# Patient Record
Sex: Female | Born: 1995 | Race: White | Hispanic: No | Marital: Single | Smoking: Never smoker
Health system: Southern US, Community
[De-identification: ages and names within clinical notes are randomized; demographics above are authoritative.]

## PROBLEM LIST (undated history)

## (undated) DIAGNOSIS — J45909 Unspecified asthma, uncomplicated: Secondary | ICD-10-CM

## (undated) DIAGNOSIS — T7840XA Allergy, unspecified, initial encounter: Secondary | ICD-10-CM

## (undated) DIAGNOSIS — F419 Anxiety disorder, unspecified: Secondary | ICD-10-CM

## (undated) DIAGNOSIS — I469 Cardiac arrest, cause unspecified: Secondary | ICD-10-CM

## (undated) DIAGNOSIS — F32A Depression, unspecified: Secondary | ICD-10-CM

## (undated) DIAGNOSIS — F329 Major depressive disorder, single episode, unspecified: Secondary | ICD-10-CM

## (undated) DIAGNOSIS — Z309 Encounter for contraceptive management, unspecified: Secondary | ICD-10-CM

## (undated) HISTORY — DX: Unspecified asthma, uncomplicated: J45.909

## (undated) HISTORY — DX: Anxiety disorder, unspecified: F41.9

## (undated) HISTORY — DX: Depression, unspecified: F32.A

## (undated) HISTORY — DX: Major depressive disorder, single episode, unspecified: F32.9

## (undated) HISTORY — DX: Encounter for contraceptive management, unspecified: Z30.9

## (undated) HISTORY — DX: Allergy, unspecified, initial encounter: T78.40XA

---
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
## 2015-08-11 ENCOUNTER — Ambulatory Visit (INDEPENDENT_AMBULATORY_CARE_PROVIDER_SITE_OTHER): Payer: BLUE CROSS/BLUE SHIELD | Admitting: Obstetrics and Gynecology

## 2015-08-11 ENCOUNTER — Encounter: Payer: Self-pay | Admitting: Obstetrics and Gynecology

## 2015-08-11 VITALS — BP 130/80 | HR 74 | Wt 189.6 lb

## 2015-08-11 DIAGNOSIS — Z113 Encounter for screening for infections with a predominantly sexual mode of transmission: Secondary | ICD-10-CM | POA: Diagnosis not present

## 2015-08-11 DIAGNOSIS — R1032 Left lower quadrant pain: Secondary | ICD-10-CM | POA: Diagnosis not present

## 2015-08-11 MED ORDER — ACETAMINOPHEN-CODEINE #3 300-30 MG PO TABS: 1.0000 | ORAL_TABLET | ORAL | Status: DC | PRN

## 2015-08-11 NOTE — Addendum Note (Signed)
Addended by: Federico FlakeNES, Lisel Siegrist A on: 08/11/2015 04:28 PM   Modules accepted: Orders

## 2015-08-11 NOTE — Progress Notes (Signed)
Patient ID: Nichole Guerra, female   DOB: 10/30/1995, 20 y.o.   MRN: [id number]   [facility]  Patient name: Nichole Guerra MRN [id number]  Date of birth: 10/30/1995  CC & HPI:  Nichole Guerra is a 20 y.o. female presenting today for pain in LLQ. Duration: 1 day, steady,  Prior episodes but they were different, and more at night. Bowel fxn normal loose stool, not related to pain.  ROS:  Single partner age 20 x  3 yr. One prior partner.    Pertinent History Reviewed:   Reviewed: Significant for panic disorder. Mother is pt of [facility]. Medical         Past Medical History  Diagnosis Date  . Allergy   . Asthma   . Depression   . Contraceptive management 12/17/2012                              Surgical Hx:   History reviewed. No pertinent past surgical history. Medications: Reviewed & Updated - see associated section                       Current outpatient prescriptions:  .  cetirizine (ZYRTEC) 10 MG tablet, Take 10 mg by mouth daily., Disp: , Rfl:  .  Fluticasone-Salmeterol (ADVAIR) 100-50 MCG/DOSE AEPB, Inhale 1 puff into the lungs daily., Disp: , Rfl:  .  sertraline (ZOLOFT) 50 MG tablet, 50 mg 2 (two) times daily. , Disp: , Rfl:  .  SPRINTEC 28 0.25-35 MG-MCG tablet, TAKE 1 TABLET BY MOUTH DAILY., Disp: 84 tablet, Rfl: 3 .  albuterol (PROVENTIL HFA;VENTOLIN HFA) 108 (90 BASE) MCG/ACT inhaler, Inhale 2 puffs into the lungs as needed for wheezing. Reported on 08/11/2015, Disp: , Rfl:  .  ibuprofen (ADVIL,MOTRIN) 800 MG tablet, 800 mg 2 (two) times daily. Reported on 08/11/2015, Disp: , Rfl:    Social History: Reviewed -  reports that she has never smoked. She has never used smokeless tobacco.  Objective Findings:  Vitals: Blood pressure 130/80, pulse 74, weight 189 lb 9.6 oz (86.002 kg), last menstrual period 07/19/2015.  Physical Examination: General appearance - alert, well appearing, and in no distress, oriented to person, place, and time, overweight and  anxious Mental status - alert, oriented to person, place, and time, normal mood, behavior, speech, dress, motor activity, and thought processes, anxious Eyes - pupils equal and reactive, extraocular eye movements intact Abdomen - soft, nontender, nondistended, no masses or organomegaly tenderness noted llq and at umbilicus mild , not guarding or rebound Pelvic - normal external genitalia, vulva, vagina, cervix, uterus and adnexa Wet prep Neg for trich , normal wbc and epithelials only, no clue cells, Extremities - peripheral pulses normal, no pedal edema, no clubbing or cyanosis Nail beds short   Assessment & Plan:   A:  1. Anxiety factor 2  Normal pelvic normal wet prep 3  LLQ pain, functional bowel pain likely   P:  1. 1. Reasssured, await [facility],  2. refil tylenol #3 3. Fu prn

## 2015-08-12 LAB — GC/CHLAMYDIA PROBE AMP
Chlamydia trachomatis, NAA: NEGATIVE
NEISSERIA GONORRHOEAE BY PCR: NEGATIVE

## 2015-11-02 ENCOUNTER — Other Ambulatory Visit: Payer: Self-pay | Admitting: Adult Health

## 2016-01-28 ENCOUNTER — Other Ambulatory Visit: Payer: Self-pay | Admitting: Adult Health

## 2016-02-22 ENCOUNTER — Emergency Department (HOSPITAL_COMMUNITY)
Admission: EM | Admit: 2016-02-22 | Discharge: 2016-02-22 | Disposition: A | Discharge status: Home / Self Care | Payer: BLUE CROSS/BLUE SHIELD | Attending: Emergency Medicine | Admitting: Emergency Medicine

## 2016-02-22 ENCOUNTER — Encounter (HOSPITAL_COMMUNITY): Payer: Self-pay | Admitting: Emergency Medicine

## 2016-02-22 DIAGNOSIS — J329 Chronic sinusitis, unspecified: Secondary | ICD-10-CM | POA: Diagnosis not present

## 2016-02-22 DIAGNOSIS — Z79899 Other long term (current) drug therapy: Secondary | ICD-10-CM | POA: Diagnosis not present

## 2016-02-22 DIAGNOSIS — F329 Major depressive disorder, single episode, unspecified: Secondary | ICD-10-CM | POA: Diagnosis not present

## 2016-02-22 DIAGNOSIS — J45909 Unspecified asthma, uncomplicated: Secondary | ICD-10-CM | POA: Insufficient documentation

## 2016-02-22 DIAGNOSIS — J069 Acute upper respiratory infection, unspecified: Secondary | ICD-10-CM | POA: Diagnosis not present

## 2016-02-22 DIAGNOSIS — R51 Headache: Secondary | ICD-10-CM | POA: Diagnosis present

## 2016-02-22 HISTORY — DX: Cardiac arrest, cause unspecified: I46.9

## 2016-02-22 MED ORDER — LORATADINE-PSEUDOEPHEDRINE ER 5-120 MG PO TB12: 1.0000 | ORAL_TABLET | Freq: Two times a day (BID) | ORAL | 0 refills | Status: DC

## 2016-02-22 MED ORDER — DEXAMETHASONE 4 MG PO TABS: 4.0000 mg | ORAL_TABLET | Freq: Two times a day (BID) | ORAL | 0 refills | Status: DC

## 2016-02-22 NOTE — ED Provider Notes (Signed)
[facility]-EMERGENCY DEPT Provider Note   CSN: [id number] Arrival date & time: 02/22/16  1337  By signing my name below, I, Placido SouLogan Joldersma, attest that this documentation has been prepared under the direction and in the presence of Ivery QualeHobson Lauretta Sallas, PA-C. Electronically Signed: Placido SouLogan Joldersma, ED Scribe. 02/22/16. 3:20 PM.   History   Chief Complaint No chief complaint on file.  HPI HPI Comments: Nichole Guerra is a 20 y.o. female who presents to the Emergency Department complaining of mild sinus congestion x 1 month. Her mother states that they have been struggling to contact their family physician to make an appointment and came to the ED today for evaluation. She reports associated sinus tenderness and a mild HA. She describes her congestion as "dark". Pt denies fever, chills, rash, nausea, vomiting and diarrhea.   The history is provided by the patient. No language interpreter was used.  Illness  This is a new problem. The current episode started more than 1 week ago. The problem occurs constantly. The problem has not changed since onset.Associated symptoms include headaches. Nothing aggravates the symptoms. Nothing relieves the symptoms. She has tried nothing for the symptoms. The treatment provided no relief.    Past Medical History:  Diagnosis Date  . Allergy   . Asthma   . Cardiac arrest (HCC)   . Contraceptive management 12/17/2012  . Depression     Patient Active Problem List   Diagnosis Date Noted  . LLQ abdominal pain 08/11/2015  . Contraceptive management 12/17/2012  . History of depression 09/21/2012    History reviewed. No pertinent surgical history.  OB History    Gravida Para Term Preterm AB Living   0 0 0 0 0 0   SAB TAB Ectopic Multiple Live Births   0 0 0 0         Home Medications    Prior to Admission medications   Medication Sig Start Date End Date Taking? Authorizing Provider  acetaminophen-codeine (TYLENOL #3) 300-30 MG tablet Take 1-2 tablets by mouth  every 4 (four) hours as needed. 08/11/15   Tilda BurrowJohn V Ferguson, MD  albuterol (PROVENTIL HFA;VENTOLIN HFA) 108 (90 BASE) MCG/ACT inhaler Inhale 2 puffs into the lungs as needed for wheezing. Reported on 08/11/2015    Historical Provider, MD  cetirizine (ZYRTEC) 10 MG tablet Take 10 mg by mouth daily.    Historical Provider, MD  Fluticasone-Salmeterol (ADVAIR) 100-50 MCG/DOSE AEPB Inhale 1 puff into the lungs daily.    Historical Provider, MD  ibuprofen (ADVIL,MOTRIN) 800 MG tablet 800 mg 2 (two) times daily. Reported on 08/11/2015 07/05/14   Historical Provider, MD  MONO-LINYAH 0.25-35 MG-MCG tablet TAKE 1 TABLET BY MOUTH EVERY DAY 01/28/16   Adline PotterJennifer A Griffin, NP  sertraline (ZOLOFT) 50 MG tablet 50 mg 2 (two) times daily.  08/28/12   Historical Provider, MD    Family History Family History  Problem Relation Age of Onset  . Hypertension Mother   . Diabetes Father   . Miscarriages / Stillbirths Maternal Grandmother   . Hypertension Maternal Grandmother   . Heart disease Maternal Grandfather   . Stroke Paternal Grandfather   . Hyperlipidemia Paternal Grandmother   . Asthma Paternal Grandmother   . COPD Paternal Grandmother   . Stroke Other     paternal great grandma    Social History Social History  Substance Use Topics  . Smoking status: Never Smoker  . Smokeless tobacco: Never Used  . Alcohol use No     Allergies   Penicillins;  Shellfish-derived products; Bactrim [sulfamethoxazole-trimethoprim]; and Yellow dyes (non-tartrazine)   Review of Systems Review of Systems  Constitutional: Negative for chills and fever.  HENT: Positive for congestion and sinus pressure. Negative for rhinorrhea.   Gastrointestinal: Negative for diarrhea, nausea and vomiting.  Skin: Negative for rash.  Neurological: Positive for headaches.   Physical Exam Updated Vital Signs BP 131/83 (BP Location: Left Arm)   Pulse 82   Temp 98.7 F (37.1 C) (Oral)   LMP 02/15/2016 (Exact Date)   SpO2 96%    Physical Exam  Constitutional: She is oriented to person, place, and time. She appears well-developed and well-nourished.  HENT:  Head: Normocephalic and atraumatic.  No facial temperature changes. Nasal congestion present.   Eyes: EOM are normal.  Neck: Normal range of motion.  Cardiovascular: Normal rate, regular rhythm and normal heart sounds.  Exam reveals no gallop and no friction rub.   No murmur heard. Pulmonary/Chest: Effort normal. No respiratory distress. She has wheezes (soft expiratory).  Abdominal: Soft. Bowel sounds are normal.  Musculoskeletal: Normal range of motion.  Lymphadenopathy:    She has no cervical adenopathy.  Neurological: She is alert and oriented to person, place, and time.  Skin: Skin is warm and dry. Capillary refill takes less than 2 seconds. No rash noted.  No rash noted to the palms.   Psychiatric: She has a normal mood and affect.  Nursing note and vitals reviewed.  ED Treatments / Results  Labs (all labs ordered are listed, but only abnormal results are displayed) Labs Reviewed - No data to display  EKG  EKG Interpretation None       Radiology No results found.  Procedures Procedures  DIAGNOSTIC STUDIES: Oxygen Saturation is 96% on RA, normal by my interpretation.    COORDINATION OF CARE: 3:20 PM Discussed next steps with pt. Pt verbalized understanding and is agreeable with the plan.    Medications Ordered in ED Medications - No data to display   Initial Impression / Assessment and Plan / ED Course  I have reviewed the triage vital signs and the nursing notes.  Pertinent labs & imaging results that were available during my care of the patient were reviewed by me and considered in my medical decision making (see chart for details).  Clinical Course    **I have reviewed nursing notes, vital signs, and all appropriate lab and imaging results for this patient.*  **I personally performed the services described in this  documentation, which was scribed in my presence. The recorded information has been reviewed and is accurate.* Final Clinical Impressions(s) / ED Diagnoses  The examination is c/w sinusitis and URI with exacerbation of asthma. Rx for claritin D and decadron given to the patient. Pt advised to increase fluids and see PCP for any additional problem.   Final diagnoses:  Sinusitis, unspecified chronicity, unspecified location  Upper respiratory tract infection, unspecified type    New Prescriptions New Prescriptions   No medications on file     Ivery Quale, PA-C 02/22/16 1554    Vanetta Mulders, MD 02/23/16 [phone]

## 2016-02-22 NOTE — ED Triage Notes (Signed)
Pt reports sinus pressure x1 month.  Pt is congested in sinuses, pt states she tries to blow nose but nothing will come out. Pt has cough.  Pt alert and oriented, no breathing difficulties.

## 2016-02-22 NOTE — Discharge Instructions (Signed)
°   Please increase water, juices, Gatorade's, etc. Use claritin D every 12 hours for congestion. Use decadron 2 times daily with food. Please see your primary MD for additional evaluation.

## 2016-04-15 ENCOUNTER — Other Ambulatory Visit: Payer: Self-pay | Admitting: Adult Health

## 2016-06-21 ENCOUNTER — Encounter: Payer: Self-pay | Admitting: Adult Health

## 2016-06-21 ENCOUNTER — Ambulatory Visit (INDEPENDENT_AMBULATORY_CARE_PROVIDER_SITE_OTHER): Payer: Managed Care, Other (non HMO) | Admitting: Adult Health

## 2016-06-21 VITALS — BP 118/60 | HR 72 | Ht 63.0 in | Wt 208.5 lb

## 2016-06-21 DIAGNOSIS — N938 Other specified abnormal uterine and vaginal bleeding: Secondary | ICD-10-CM | POA: Diagnosis not present

## 2016-06-21 DIAGNOSIS — Z3041 Encounter for surveillance of contraceptive pills: Secondary | ICD-10-CM | POA: Diagnosis not present

## 2016-06-21 DIAGNOSIS — R252 Cramp and spasm: Secondary | ICD-10-CM

## 2016-06-21 DIAGNOSIS — Z3202 Encounter for pregnancy test, result negative: Secondary | ICD-10-CM

## 2016-06-21 DIAGNOSIS — N926 Irregular menstruation, unspecified: Secondary | ICD-10-CM

## 2016-06-21 LAB — POCT URINE PREGNANCY: PREG TEST UR: NEGATIVE

## 2016-06-21 MED ORDER — NORGESTIMATE-ETH ESTRADIOL 0.25-35 MG-MCG PO TABS: 1.0000 | ORAL_TABLET | Freq: Every day | ORAL | 3 refills | Status: DC

## 2016-06-21 NOTE — Progress Notes (Signed)
Subjective:     Patient ID: Nichole Guerra, female   DOB: 1995/07/14, 21 y.o.   MRN: [id number]  HPI [name] is a 21 year old white female in complaining of having 2 week episode of bleeding, 1 week like period and 1 light then heavy then light with cramps,no pain with sex.She would like to explore long term birth control options, too.She says she has not missed a pill or been on any new meds.    Review of Systems Had 2 week episode of bleeding, 1 week like period and 1 light then heavy then light with cramps,no pain with sex Reviewed past medical,surgical, social and family history. Reviewed medications and allergies.     Objective:   Physical Exam BP 118/60 (BP Location: Left Arm, Patient Position: Sitting, Cuff Size: Large)   Pulse 72   Ht 5\' 3"  (1.6 m)   Wt 208 lb 8 oz (94.6 kg)   LMP 05/31/2016 (Approximate)   BMI 36.93 kg/m UPT negative.PHQ 2 score 0. Skin warm and dry.Pelvic: external genitalia is normal in appearance no lesions, vagina: scant discharge without odor,urethra has no lesions or masses noted, cervix:smooth and nulliparous, uterus: normal size, shape and contour, non tender, no masses felt, adnexa: no masses or tenderness noted. Bladder is non tender and no masses felt.  GC/CHL obtained.    Discussed IUD, and nexplanon, there risks and benefits and the need to insertion when on period, and if chooses IUD will need cytotec morning of procedure.Call Angie to check insurance benefits when choice made. Face time 15 minutes with 50% counseling as above.  Assessment:     1. Irregular bleeding   2. Encounter for surveillance of contraceptive pills   3. Pregnancy examination or test, negative result       Plan:     GC/CHL sent Refilled ortho cyclen for 1 year Continue OCs for now and review handouts on IUD and nexplanon Follow up prn

## 2016-06-24 LAB — GC/CHLAMYDIA PROBE AMP
CHLAMYDIA, DNA PROBE: NEGATIVE
Neisseria gonorrhoeae by PCR: NEGATIVE

## 2017-01-27 ENCOUNTER — Ambulatory Visit: Payer: BLUE CROSS/BLUE SHIELD | Admitting: Family Medicine

## 2017-02-02 ENCOUNTER — Ambulatory Visit (INDEPENDENT_AMBULATORY_CARE_PROVIDER_SITE_OTHER): Payer: Managed Care, Other (non HMO) | Admitting: Family Medicine

## 2017-02-02 ENCOUNTER — Encounter: Payer: Self-pay | Admitting: Family Medicine

## 2017-02-02 VITALS — BP 118/66 | HR 88 | Temp 98.3°F | Resp 18 | Ht 63.0 in | Wt 214.3 lb

## 2017-02-02 DIAGNOSIS — E669 Obesity, unspecified: Secondary | ICD-10-CM | POA: Insufficient documentation

## 2017-02-02 DIAGNOSIS — Z91018 Allergy to other foods: Secondary | ICD-10-CM | POA: Insufficient documentation

## 2017-02-02 DIAGNOSIS — J4540 Moderate persistent asthma, uncomplicated: Secondary | ICD-10-CM | POA: Diagnosis not present

## 2017-02-02 DIAGNOSIS — Z9109 Other allergy status, other than to drugs and biological substances: Secondary | ICD-10-CM | POA: Diagnosis not present

## 2017-02-02 DIAGNOSIS — Z6837 Body mass index (BMI) 37.0-37.9, adult: Secondary | ICD-10-CM | POA: Diagnosis not present

## 2017-02-02 DIAGNOSIS — E661 Drug-induced obesity: Secondary | ICD-10-CM

## 2017-02-02 DIAGNOSIS — Z23 Encounter for immunization: Secondary | ICD-10-CM

## 2017-02-02 DIAGNOSIS — F418 Other specified anxiety disorders: Secondary | ICD-10-CM

## 2017-02-02 MED ORDER — FLUOXETINE HCL 20 MG PO TABS: 20.0000 mg | ORAL_TABLET | Freq: Every day | ORAL | 3 refills | Status: DC

## 2017-02-02 MED ORDER — FLUTICASONE-SALMETEROL 250-50 MCG/DOSE IN AEPB: 1.0000 | INHALATION_SPRAY | Freq: Two times a day (BID) | RESPIRATORY_TRACT | 3 refills | Status: DC

## 2017-02-02 MED ORDER — MONTELUKAST SODIUM 10 MG PO TABS: 10.0000 mg | ORAL_TABLET | Freq: Every day | ORAL | 3 refills | Status: DC

## 2017-02-02 NOTE — Progress Notes (Signed)
Chief Complaint  Patient presents with  . Follow-up  new patient Significant problems with asthma and allergies.  Food allergies, environmental allergies, drug allergies and wheezing.  Is on Dulera, but says she had better result with advair.  Uses albuterol infrequently.  Is on singulair daily.  Never been seen by allergy specialist due to her fear of the multiple injections for allergy shots and testing.  Today agrees to go. She also has trouble with anxiety.  She went to the ER last weekend with panic attack.  Feels her Zoloft is not working any more.  Has been on may years.  Blames the zoloft for gaining weight.  Has little energy.  No thoughts of harming self or others. Poor exercise tolerance due to asthma.  Discussed pre-medicating with albuterol to prevent wheezing.  Needs to slowly build back up walking tolerance for her health and to lose weight. Sees GYN and takes an OCP.   Up to date with immunizations    Patient Active Problem List   Diagnosis Date Noted  . Asthma, moderate persistent 02/02/2017  . Environmental allergies 02/02/2017  . Food allergy 02/02/2017  . Depression with anxiety 02/02/2017  . 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 Contraceptive management 12/17/2012    Outpatient Encounter Prescriptions as of 02/02/2017  Medication Sig  . albuterol (PROVENTIL HFA;VENTOLIN HFA) 108 (90 BASE) MCG/ACT inhaler Inhale 2 puffs into the lungs as needed for wheezing. Reported on 08/11/2015  . cetirizine (ZYRTEC) 10 MG tablet Take 10 mg by mouth daily.  . hydrOXYzine (ATARAX/VISTARIL) 50 MG tablet Take 50 mg by mouth daily.  . mometasone-formoterol (DULERA) 100-5 MCG/ACT AERO Inhale 2 puffs into the lungs 2 (two) times daily.  . montelukast (SINGULAIR) 10 MG tablet Take 1 tablet (10 mg total) by mouth at bedtime.  . norgestimate-ethinyl estradiol (MONO-LINYAH) 0.25-35 MG-MCG tablet Take 1 tablet by mouth daily.  Marland Kitchen  FLUoxetine (PROZAC) 20 MG tablet Take 1 tablet (20 mg total) by mouth daily.  . Fluticasone-Salmeterol (ADVAIR DISKUS) 250-50 MCG/DOSE AEPB Inhale 1 puff into the lungs 2 (two) times daily.   No facility-administered encounter medications on file as of 02/02/2017.     Past Medical History:  Diagnosis Date  . Allergy   . Anxiety   . Asthma   . Cardiac arrest (HCC)   . Contraceptive management 12/17/2012  . Depression     History reviewed. No pertinent surgical history.  Social History   Social History  . Marital status: Single    Spouse name: N/A  . Number of children: N/A  . Years of education: 5- GED   Occupational History  . pharmacy reg [employer]   Social History Main Topics  . Smoking status: Never Smoker  . Smokeless tobacco: Never Used  . Alcohol use No  . Drug use: No  . Sexual activity: Yes    Birth control/ protection: Pill   Other Topics Concern  . Not on file   Social History Narrative   Lives with parents   Younger brother Casimiro Needle in the home   Artistic, likes to craft and draw   Likes outdoors    Family History  Problem Relation Age of Onset  . Hypertension Mother   . Other Mother        pre-cancerous cells on cervix  . Diabetes Father   . Miscarriages / Stillbirths Maternal Grandmother   . Hypertension Maternal Grandmother   . Heart  disease Maternal Grandfather   . Stroke Paternal Grandfather   . Hyperlipidemia Paternal Grandmother   . Asthma Paternal Grandmother   . COPD Paternal Grandmother   . Stroke Other        paternal great grandma  . Cancer Maternal Uncle        prostate, bladder, bone    Review of Systems  Constitutional: Negative for chills, fever and weight loss.  HENT: Negative for congestion and hearing loss.   Eyes: Negative for blurred vision and pain.  Respiratory: Positive for shortness of breath. Negative for cough.   Cardiovascular: Negative for chest pain and leg swelling.  Gastrointestinal: Negative for abdominal  pain, constipation, diarrhea and heartburn.  Genitourinary: Negative for dysuria and frequency.  Musculoskeletal: Negative for falls, joint pain and myalgias.  Neurological: Negative for dizziness, seizures and headaches.  Psychiatric/Behavioral: Negative for depression. The patient is nervous/anxious. The patient does not have insomnia.     BP 118/66 (BP Location: Right Arm, Patient Position: Sitting, Cuff Size: Normal)   Pulse 88   Temp 98.3 F (36.8 C) (Temporal)   Resp 18   Ht  (1.6 m)   Wt 214 lb 4.8 oz (97.2 kg)   LMP 01/19/2017 (Exact Date)   SpO2 98%   BMI 37.96 kg/m   Physical Exam  Constitutional: She is oriented to person, place, and time. She appears well-developed and well-nourished.  HENT:  Head: Normocephalic and atraumatic.  Mouth/Throat: Oropharynx is clear and moist.  Eyes: Pupils are equal, round, and reactive to light. Conjunctivae are normal.  Neck: Normal range of motion. Neck supple. No thyromegaly present.  Cardiovascular: Normal rate, regular rhythm and normal heart sounds.   Pulmonary/Chest: Effort normal and breath sounds normal. No respiratory distress.  Breathless when speaking - but lungs clear  Abdominal: Soft. Bowel sounds are normal.  Musculoskeletal: Normal range of motion. She exhibits no edema.  Lymphadenopathy:    She has no cervical adenopathy.  Neurological: She is alert and oriented to person, place, and time.  Gait normal  Skin: Skin is warm and dry.  Psychiatric: She has a normal mood and affect. Her behavior is normal. Thought content normal.  anxious  Nursing note and vitals reviewed.  ASSESSMENT/PLAN:  1. Moderate persistent asthma without complication - Ambulatory referral to Allergy  2. Environmental allergies  3. Food allergy  4. Depression with anxiety  5. Class 2 drug-induced obesity without serious comorbidity with body mass index (BMI) of 37.0 to 37.9 in adult - CBC with Differential/Platelet - COMPLETE  METABOLIC PANEL WITH GFR - Hemoglobin A1c - TSH - Urinalysis, Routine w reflex microscopic - Lipid panel  6. Need for pneumococcal vaccination - Pneumococcal conjugate vaccine 13-valent IM Greater than 50% of this visit was spent in counseling and coordinating care.  Total face to face time:   60 min spent in discussing asthma managekemtna nd referral, also SSRI medicines , side effects and self care for depression   Patient Instructions  Stop the sertraline ( zoloft) Take the Fluoxetine (prozac) daily  I am referring you to an allergy specialist for additional testing  You are due for blood work Prevnar today  Walk every day that you are able  See me in one month for PE    Eustace Moore, MD

## 2017-02-02 NOTE — Patient Instructions (Signed)
Stop the sertraline ( zoloft) Take the Fluoxetine (prozac) daily  I am referring you to an allergy specialist for additional testing  You are due for blood work Prevnar today  Walk every day that you are able  See me in one month for PE

## 2017-02-03 ENCOUNTER — Ambulatory Visit: Payer: BLUE CROSS/BLUE SHIELD | Admitting: Family Medicine

## 2017-02-03 LAB — COMPLETE METABOLIC PANEL WITH GFR
AG RATIO: 1.4 (calc) (ref 1.0–2.5)
ALBUMIN MSPROF: 3.8 g/dL (ref 3.6–5.1)
ALT: 14 U/L (ref 6–29)
AST: 13 U/L (ref 10–30)
Alkaline phosphatase (APISO): 68 U/L (ref 33–115)
BILIRUBIN TOTAL: 0.3 mg/dL (ref 0.2–1.2)
BUN / CREAT RATIO: 8 (calc) (ref 6–22)
BUN: 6 mg/dL — ABNORMAL LOW (ref 7–25)
CALCIUM: 8.7 mg/dL (ref 8.6–10.2)
CHLORIDE: 106 mmol/L (ref 98–110)
CO2: 24 mmol/L (ref 20–32)
Creat: 0.72 mg/dL (ref 0.50–1.10)
GFR, EST AFRICAN AMERICAN: 140 mL/min/{1.73_m2} (ref >=60)
GFR, EST NON AFRICAN AMERICAN: 121 mL/min/{1.73_m2} (ref >=60)
GLOBULIN: 2.7 g/dL (ref 1.9–3.7)
Glucose, Bld: 114 mg/dL (ref 65–139)
POTASSIUM: 3.9 mmol/L (ref 3.5–5.3)
SODIUM: 139 mmol/L (ref 135–146)
TOTAL PROTEIN: 6.5 g/dL (ref 6.1–8.1)

## 2017-02-03 LAB — CBC WITH DIFFERENTIAL/PLATELET
BASOS PCT: 0.8 %
Basophils Absolute: 60 cells/uL (ref 0–200)
EOS ABS: 1028 {cells}/uL — AB (ref 15–500)
Eosinophils Relative: 13.7 %
HEMATOCRIT: 37.4 % (ref 35.0–45.0)
HEMOGLOBIN: 12.5 g/dL (ref 11.7–15.5)
LYMPHS ABS: 2678 {cells}/uL (ref 850–3900)
MCH: 26.2 pg — AB (ref 27.0–33.0)
MCHC: 33.4 g/dL (ref 32.0–36.0)
MCV: 78.2 fL — AB (ref 80.0–100.0)
MPV: 10.2 fL (ref 7.5–12.5)
Monocytes Relative: 6 %
Neutro Abs: 3285 cells/uL (ref 1500–7800)
Neutrophils Relative %: 43.8 %
Platelets: 409 10*3/uL — ABNORMAL HIGH (ref 140–400)
RBC: 4.78 10*6/uL (ref 3.80–5.10)
RDW: 14.1 % (ref 11.0–15.0)
TOTAL LYMPHOCYTE: 35.7 %
WBC: 7.5 10*3/uL (ref 3.8–10.8)
WBCMIX: 450 {cells}/uL (ref 200–950)

## 2017-02-03 LAB — URINALYSIS, ROUTINE W REFLEX MICROSCOPIC
BILIRUBIN URINE: NEGATIVE
Bacteria, UA: NONE SEEN /HPF
GLUCOSE, UA: NEGATIVE
HGB URINE DIPSTICK: NEGATIVE
Hyaline Cast: NONE SEEN /LPF
KETONES UR: NEGATIVE
NITRITE: NEGATIVE
PH: 6 (ref 5.0–8.0)
Protein, ur: NEGATIVE
RBC / HPF: NONE SEEN /HPF (ref 0–2)
SPECIFIC GRAVITY, URINE: 1.021 (ref 1.001–1.03)

## 2017-02-03 LAB — LIPID PANEL
CHOL/HDL RATIO: 3.2 (calc) (ref <5.0)
Cholesterol: 136 mg/dL (ref <200)
HDL: 42 mg/dL — ABNORMAL LOW (ref >50)
LDL Cholesterol (Calc): 68 mg/dL (calc)
Non-HDL Cholesterol (Calc): 94 mg/dL (calc) (ref <130)
Triglycerides: 182 mg/dL — ABNORMAL HIGH (ref <150)

## 2017-02-03 LAB — TSH: TSH: 2.93 mIU/L

## 2017-02-03 LAB — HEMOGLOBIN A1C
HEMOGLOBIN A1C: 5.3 %{Hb} (ref <5.7)
Mean Plasma Glucose: 105 (calc)
eAG (mmol/L): 5.8 (calc)

## 2017-02-08 ENCOUNTER — Encounter: Payer: Self-pay | Admitting: Family Medicine

## 2017-02-15 ENCOUNTER — Other Ambulatory Visit: Payer: Self-pay | Admitting: *Deleted

## 2017-02-15 MED ORDER — NORGESTIMATE-ETH ESTRADIOL 0.25-35 MG-MCG PO TABS: 1.0000 | ORAL_TABLET | Freq: Every day | ORAL | 3 refills | Status: DC

## 2017-03-08 ENCOUNTER — Other Ambulatory Visit: Payer: Self-pay

## 2017-03-09 ENCOUNTER — Encounter: Payer: Self-pay | Admitting: Family Medicine

## 2017-03-09 ENCOUNTER — Ambulatory Visit (INDEPENDENT_AMBULATORY_CARE_PROVIDER_SITE_OTHER): Payer: Managed Care, Other (non HMO) | Admitting: Family Medicine

## 2017-03-09 VITALS — BP 130/80 | HR 92 | Temp 98.1°F | Resp 16 | Ht 63.0 in | Wt 208.0 lb

## 2017-03-09 DIAGNOSIS — Z0000 Encounter for general adult medical examination without abnormal findings: Secondary | ICD-10-CM

## 2017-03-09 DIAGNOSIS — F418 Other specified anxiety disorders: Secondary | ICD-10-CM

## 2017-03-09 MED ORDER — FLUOXETINE HCL 40 MG PO CAPS: 40.0000 mg | ORAL_CAPSULE | Freq: Every day | ORAL | 3 refills | Status: DC

## 2017-03-09 NOTE — Progress Notes (Signed)
Chief Complaint  Patient presents with  . Annual Exam   Here for annual examination. She had her flu shot last time. She is up-to-date with Pap smear. At last visit she was switched from sertraline to Prozac.  She feels like it is helping.  She has lost weight.  She is more energetic.  No side effects.  She states she still has a few panic attacks.  She wonders whether her dose can be increased.  She is taking 20 mg a day.  I will place her on 40 mg a day.  She will let me know if this does not work  Patient Active Problem List   Diagnosis Date Noted  . Asthma, moderate persistent 02/02/2017  . Environmental allergies 02/02/2017  . Food allergy 02/02/2017  . Depression with anxiety 02/02/2017  . 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 Contraceptive management 12/17/2012    Outpatient Encounter Prescriptions as of 03/09/2017  Medication Sig  . albuterol (PROVENTIL HFA;VENTOLIN HFA) 108 (90 BASE) MCG/ACT inhaler Inhale 2 puffs into the lungs as needed for wheezing. Reported on 08/11/2015  . cetirizine (ZYRTEC) 10 MG tablet Take 10 mg by mouth daily.  . Fluticasone-Salmeterol (ADVAIR DISKUS) 250-50 MCG/DOSE AEPB Inhale 1 puff into the lungs 2 (two) times daily.  . hydrOXYzine (ATARAX/VISTARIL) 50 MG tablet Take 50 mg by mouth daily.  . mometasone-formoterol (DULERA) 100-5 MCG/ACT AERO Inhale 2 puffs into the lungs 2 (two) times daily.  . montelukast (SINGULAIR) 10 MG tablet Take 1 tablet (10 mg total) by mouth at bedtime.  . norgestimate-ethinyl estradiol (MONO-LINYAH) 0.25-35 MG-MCG tablet Take 1 tablet by mouth daily.  . [DISCONTINUED] FLUoxetine (PROZAC) 20 MG tablet Take 1 tablet (20 mg total) by mouth daily.  Marland Kitchen. FLUoxetine (PROZAC) 40 MG capsule Take 1 capsule (40 mg total) by mouth daily.   No facility-administered encounter medications on file as of 03/09/2017.     Allergies  Allergen Reactions  . Penicillins  Anaphylaxis    Throat swells up.  Marland Kitchen. Shellfish-Derived Products Anaphylaxis  . Bactrim [Sulfamethoxazole-Trimethoprim] Hives  . Yellow Dyes (Non-Tartrazine) Rash    Review of Systems  Constitutional: Negative for activity change, appetite change and unexpected weight change.  HENT: Negative for congestion, dental problem, postnasal drip and rhinorrhea.   Eyes: Negative for redness and visual disturbance.  Respiratory: Negative for cough and shortness of breath.   Cardiovascular: Negative for chest pain, palpitations and leg swelling.  Gastrointestinal: Negative for abdominal pain, constipation and diarrhea.  Genitourinary: Negative for difficulty urinating, frequency and menstrual problem.  Musculoskeletal: Negative for arthralgias and back pain.  Neurological: Negative for dizziness and headaches.  Psychiatric/Behavioral: Negative for dysphoric mood and sleep disturbance. The patient is not nervous/anxious.        Anxiety improved    BP 130/80 (BP Location: Right Arm, Patient Position: Sitting, Cuff Size: Normal)   Pulse 92   Temp 98.1 F (36.7 C) (Temporal)   Resp 16   Ht 5\' 3"  (1.6 m)   Wt 208 lb 0.6 oz (94.4 kg)   SpO2 98%   BMI 36.85 kg/m   Physical Exam BP 130/80 (BP Location: Right Arm, Patient Position: Sitting, Cuff Size: Normal)   Pulse 92   Temp 98.1 F (36.7 C) (Temporal)   Resp 16   Ht 5\' 3"  (1.6 m)   Wt 208 lb 0.6 oz (94.4 kg)   SpO2 98%   BMI 36.85 kg/m  General Appearance:    Alert, cooperative, no distress, appears stated age  Head:    Normocephalic, without obvious abnormality, atraumatic  Eyes:    PERRL, conjunctiva/corneas clear, EOM's intact, fundi    benign, both eyes  Ears:    Normal TM's and external ear canals, both ears  Nose:   Nares normal, septum midline, mucosa normal, no drainage    or sinus tenderness  Throat:   Lips, mucosa, and tongue normal; teeth in good repair and gums normal  Neck:   Supple, symmetrical, trachea midline, no  adenopathy;    thyroid:  no enlargement/tenderness/nodules  Back:     Symmetric, no curvature, ROM normal, no CVA tenderness  Lungs:     Clear to auscultation bilaterally, respirations unlabored  Chest Wall:    No tenderness or deformity   Heart:    Regular rate and rhythm, S1 and S2 normal, no murmur, rub   or gallop  Breast Exam:   Deferred.  Discussed self breast exam  Abdomen:     Soft, non-tender, bowel sounds active all four quadrants,    no masses, no organomegaly  Extremities:   Extremities normal, atraumatic, no cyanosis or edema  Pulses:   2+ and symmetric all extremities  Skin:   Skin color, texture, turgor normal, no rashes or lesions. striae on abdomen  Lymph nodes:   Cervical, supraclavicular, and axillary nodes normal  Neurologic:   CNII-XII intact, normal strength, sensation and reflexes    throughout   ASSESSMENT/PLAN:  1. Annual physical exam With no abnormal findings  2. Depression with anxiety Improved on fluoxetine  Discussed with patient safe sex, prevention of STD.  Discussed immunizations, patient is up-to-date.  Discussed referral to allergy, patient has appointment next week.  Discussed healthy lifestyle, diet, exercise recommendations.  Goal is to continue to lose weight.   Patient Instructions  Take the fluoxetine 40 mg a day Congratulations on losing 6 lbs Exercise every day that you are able See me in 6 months Call sooner for problems   Eustace Moore, MD

## 2017-03-09 NOTE — Patient Instructions (Signed)
Take the fluoxetine 40 mg a day Congratulations on losing 6 lbs Exercise every day that you are able See me in 6 months Call sooner for problems

## 2017-03-17 ENCOUNTER — Encounter: Payer: Self-pay | Admitting: Family Medicine

## 2017-03-21 ENCOUNTER — Telehealth: Payer: Self-pay

## 2017-03-21 MED ORDER — HYDROXYZINE PAMOATE 50 MG PO CAPS: 50.0000 mg | ORAL_CAPSULE | Freq: Three times a day (TID) | ORAL | 0 refills | Status: DC | PRN

## 2017-03-21 MED ORDER — BENZONATATE 100 MG PO CAPS: 100.0000 mg | ORAL_CAPSULE | Freq: Two times a day (BID) | ORAL | 0 refills | Status: DC | PRN

## 2017-03-21 NOTE — Telephone Encounter (Signed)
rx sent to pharmacy

## 2017-03-28 ENCOUNTER — Ambulatory Visit: Payer: Managed Care, Other (non HMO) | Admitting: Allergy & Immunology

## 2017-03-29 ENCOUNTER — Other Ambulatory Visit: Payer: Self-pay

## 2017-04-04 ENCOUNTER — Encounter: Payer: Self-pay | Admitting: Allergy & Immunology

## 2017-04-14 ENCOUNTER — Other Ambulatory Visit: Payer: Self-pay

## 2017-06-30 ENCOUNTER — Other Ambulatory Visit: Payer: Self-pay | Admitting: Family Medicine

## 2017-06-30 NOTE — Telephone Encounter (Signed)
Seen 10 25 18 

## 2017-07-19 ENCOUNTER — Encounter: Payer: Self-pay | Admitting: Family Medicine

## 2017-07-20 ENCOUNTER — Other Ambulatory Visit: Payer: Self-pay | Admitting: Family Medicine

## 2017-07-20 MED ORDER — MONTELUKAST SODIUM 10 MG PO TABS: 10.0000 mg | ORAL_TABLET | Freq: Every day | ORAL | 1 refills | Status: DC

## 2017-07-20 MED ORDER — ALBUTEROL SULFATE HFA 108 (90 BASE) MCG/ACT IN AERS: 2.0000 | INHALATION_SPRAY | RESPIRATORY_TRACT | 1 refills | Status: DC | PRN

## 2017-07-24 ENCOUNTER — Other Ambulatory Visit: Payer: Self-pay

## 2017-07-24 ENCOUNTER — Telehealth: Payer: Self-pay

## 2017-07-24 ENCOUNTER — Encounter: Payer: Self-pay | Admitting: Family Medicine

## 2017-07-24 MED ORDER — ALBUTEROL SULFATE HFA 108 (90 BASE) MCG/ACT IN AERS: 2.0000 | INHALATION_SPRAY | Freq: Four times a day (QID) | RESPIRATORY_TRACT | 1 refills | Status: DC | PRN

## 2017-07-24 NOTE — Telephone Encounter (Signed)
[facility] and stated that the Albuterol that was submitted for patient did not have any frequency attached, on how many times she can use the inhaler. Please advise on how often and I will submit.  Thank you!

## 2017-07-24 NOTE — Telephone Encounter (Signed)
Albuterol inhaler 2 puffs every 6 hours as needed wheezing

## 2017-07-24 NOTE — Telephone Encounter (Signed)
Submitted

## 2017-08-31 ENCOUNTER — Ambulatory Visit: Payer: Managed Care, Other (non HMO) | Admitting: Family Medicine

## 2018-01-08 ENCOUNTER — Other Ambulatory Visit: Payer: Self-pay | Admitting: Adult Health

## 2018-01-09 ENCOUNTER — Other Ambulatory Visit: Payer: Self-pay | Admitting: Adult Health

## 2018-01-09 MED ORDER — NORGESTIMATE-ETH ESTRADIOL 0.25-35 MG-MCG PO TABS: 1.0000 | ORAL_TABLET | Freq: Every day | ORAL | 3 refills | Status: DC

## 2018-01-09 NOTE — Progress Notes (Signed)
Refill oCs 

## 2018-06-11 ENCOUNTER — Encounter: Payer: Self-pay | Admitting: Family Medicine

## 2018-06-11 ENCOUNTER — Ambulatory Visit: Payer: Managed Care, Other (non HMO) | Admitting: Family Medicine

## 2018-06-11 VITALS — BP 129/92 | HR 83 | Temp 96.7°F | Ht 62.0 in | Wt 208.8 lb

## 2018-06-11 DIAGNOSIS — E669 Obesity, unspecified: Secondary | ICD-10-CM | POA: Diagnosis not present

## 2018-06-11 DIAGNOSIS — F418 Other specified anxiety disorders: Secondary | ICD-10-CM | POA: Diagnosis not present

## 2018-06-11 DIAGNOSIS — Z91018 Allergy to other foods: Secondary | ICD-10-CM | POA: Diagnosis not present

## 2018-06-11 DIAGNOSIS — J4540 Moderate persistent asthma, uncomplicated: Secondary | ICD-10-CM

## 2018-06-11 MED ORDER — EPINEPHRINE 0.3 MG/0.3ML IJ SOAJ: 0.3000 mg | INTRAMUSCULAR | 1 refills | Status: DC | PRN

## 2018-06-11 MED ORDER — HYDROXYZINE HCL 50 MG PO TABS: 50.0000 mg | ORAL_TABLET | Freq: Every day | ORAL | 2 refills | Status: DC

## 2018-06-11 MED ORDER — ESCITALOPRAM OXALATE 10 MG PO TABS: 10.0000 mg | ORAL_TABLET | Freq: Every day | ORAL | 1 refills | Status: DC

## 2018-06-11 MED ORDER — FLUTICASONE FUROATE-VILANTEROL 100-25 MCG/INH IN AEPB
1.0000 | INHALATION_SPRAY | Freq: Every day | RESPIRATORY_TRACT | 5 refills | Status: DC
Start: 2018-06-11 — End: 2021-09-17

## 2018-06-11 MED ORDER — MONTELUKAST SODIUM 10 MG PO TABS: 10.0000 mg | ORAL_TABLET | Freq: Every day | ORAL | 3 refills | Status: DC

## 2018-06-11 MED ORDER — ALBUTEROL SULFATE HFA 108 (90 BASE) MCG/ACT IN AERS: 2.0000 | INHALATION_SPRAY | Freq: Four times a day (QID) | RESPIRATORY_TRACT | 1 refills | Status: DC | PRN

## 2018-06-11 NOTE — Progress Notes (Signed)
BP (!) 129/92   Pulse 83   Temp (!) 96.7 F (35.9 C) (Oral)   Ht 5\' 2"  (1.575 m)   Wt 208 lb 12.8 oz (94.7 kg)   LMP 06/04/2018   BMI 38.19 kg/m    Subjective:    Patient ID: Nichole Guerra, female    DOB: 1995/07/25, 23 y.o.   MRN: [id number]  HPI: Nichole Glassmanngel Guerra is a 23 y.o. female presenting on 06/11/2018 for New Patient (Initial Visit); Establish Care; and Anxiety (Patient was taking Prozac but has been off of it x 6 months . Patient would like to try something else.)   HPI Depression and anxiety Patient is coming in to discuss depression anxiety and to establish as a new patient in her office.  She says in the past she was taken Prozac but has been off of it for 6 months and she was taken it for anxiety but she did not like the way Prozac made her feel and would like to try something different.  She says that her depression and anxiety has been increasing over the past year and has been causing her a lot more issues.  She says she feels sad sometimes but is mostly feeling anxious a lot of the times and her anxiety builds up to the point where she just cannot function at times and she is still living at home with her mother.  Patient denies any suicidal ideations or thoughts of hurting herself. Depression screen [facility] 2/9 06/11/2018 02/02/2017 06/21/2016  Decreased Interest 1 0 0  Down, Depressed, Hopeless 1 0 0  PHQ - 2 Score 2 0 0  Altered sleeping 3 - -  Tired, decreased energy 2 - -  Change in appetite 1 - -  Feeling bad or failure about yourself  0 - -  Trouble concentrating 1 - -  Moving slowly or fidgety/restless 2 - -  Suicidal thoughts 0 - -  PHQ-9 Score 11 - -    Patient does have asthma and is coming to establish for that as well.  She currently takes Singulair and has an albuterol inhaler but has not had any major issues in quite some time.  Patient also does have chronic food allergies and has had a reaction at times where she has had swelling in the face and rash.  She  denies any issues currently just wants refills of medication.  Relevant past medical, surgical, family and social history reviewed and updated as indicated. Interim medical history since our last visit reviewed. Allergies and medications reviewed and updated.  Review of Systems  Constitutional: Negative for chills and fever.  Eyes: Negative for visual disturbance.  Respiratory: Negative for chest tightness and shortness of breath.   Cardiovascular: Negative for chest pain and leg swelling.  Musculoskeletal: Negative for back pain and gait problem.  Skin: Negative for rash.  Neurological: Negative for light-headedness and headaches.  Psychiatric/Behavioral: Positive for dysphoric mood. Negative for agitation, behavioral problems, self-injury, sleep disturbance and suicidal ideas. The patient is nervous/anxious.   All other systems reviewed and are negative.   Per HPI unless specifically indicated above  Social History   Socioeconomic History  . Marital status: Single    Spouse name: Not on file  . Number of children: Not on file  . Years of education: 6812- GED  . Highest education level: Not on file  Occupational History  . Occupation: pharmacy reg    Employer: [email or web address]  Social Needs  . Financial resource strain: Not  on file  . Food insecurity:    Worry: Not on file    Inability: Not on file  . Transportation needs:    Medical: Not on file    Non-medical: Not on file  Tobacco Use  . Smoking status: Never Smoker  . Smokeless tobacco: Never Used  Substance and Sexual Activity  . Alcohol use: No    Comment: social infrequent  . Drug use: No  . Sexual activity: Yes    Birth control/protection: Pill    Comment: same female partner for 6 years  Lifestyle  . Physical activity:    Days per week: Not on file    Minutes per session: Not on file  . Stress: Not on file  Relationships  . Social connections:    Talks on phone: Not on file    Gets together: Not on file    Attends  religious service: Not on file    Active member of club or organization: Not on file    Attends meetings of clubs or organizations: Not on file    Relationship status: Not on file  . Intimate partner violence:    Fear of current or ex partner: Not on file    Emotionally abused: Not on file    Physically abused: Not on file    Forced sexual activity: Not on file  Other Topics Concern  . Not on file  Social History Narrative   Lives with parents   Younger brother Casimiro NeedleMichael in the home   Artistic, likes to craft and draw   Likes outdoors    History reviewed. No pertinent surgical history.  Family History  Problem Relation Age of Onset  . Hypertension Mother   . Other Mother        pre-cancerous cells on cervix  . Diabetes Father   . Miscarriages / Stillbirths Maternal Grandmother   . Hypertension Maternal Grandmother   . Heart disease Maternal Grandfather   . Stroke Paternal Grandfather   . Hyperlipidemia Paternal Grandmother   . Asthma Paternal Grandmother   . COPD Paternal Grandmother   . Stroke Other        paternal great grandma  . Cancer Maternal Uncle        prostate, bladder, bone  . Cancer Paternal Uncle        lung    Allergies as of 06/11/2018      Reactions   Penicillins Anaphylaxis   Throat swells up.   Shellfish-derived Products Anaphylaxis   Bactrim [sulfamethoxazole-trimethoprim] Hives   Yellow Dyes (non-tartrazine) Rash      Medication List       Accurate as of June 11, 2018 11:59 PM. Always use your most recent med list.        albuterol 108 (90 Base) MCG/ACT inhaler Commonly known as:  PROVENTIL HFA;VENTOLIN HFA Inhale 2 puffs into the lungs every 6 (six) hours as needed for wheezing. Reported on 08/11/2015   cetirizine 10 MG tablet Commonly known as:  ZYRTEC Take 10 mg by mouth daily.   EPINEPHrine 0.3 mg/0.3 mL Soaj injection Commonly known as:  EPI-PEN Inject 0.3 mLs (0.3 mg total) into the muscle as needed for anaphylaxis.     escitalopram 10 MG tablet Commonly known as:  LEXAPRO Take 1 tablet (10 mg total) by mouth daily.   fluticasone furoate-vilanterol 100-25 MCG/INH Aepb Commonly known as:  BREO ELLIPTA Inhale 1 puff into the lungs daily.   hydrOXYzine 50 MG tablet Commonly known as:  ATARAX/VISTARIL Take  1 tablet (50 mg total) by mouth daily.   montelukast 10 MG tablet Commonly known as:  SINGULAIR Take 1 tablet (10 mg total) by mouth at bedtime.   norgestimate-ethinyl estradiol 0.25-35 MG-MCG tablet Commonly known as:  MONO-LINYAH Take 1 tablet by mouth daily.          Objective:    BP (!) 129/92   Pulse 83   Temp (!) 96.7 F (35.9 C) (Oral)   Ht 5\' 2"  (1.575 m)   Wt 208 lb 12.8 oz (94.7 kg)   LMP 06/04/2018   BMI 38.19 kg/m   Wt Readings from Last 3 Encounters:  06/11/18 208 lb 12.8 oz (94.7 kg)  03/09/17 208 lb 0.6 oz (94.4 kg)  02/02/17 214 lb 4.8 oz (97.2 kg)    Physical Exam Vitals signs and nursing note reviewed.  Constitutional:      General: She is not in acute distress.    Appearance: She is well-developed. She is not diaphoretic.  Eyes:     Conjunctiva/sclera: Conjunctivae normal.     Pupils: Pupils are equal, round, and reactive to light.  Cardiovascular:     Rate and Rhythm: Normal rate and regular rhythm.     Heart sounds: Normal heart sounds. No murmur.  Pulmonary:     Effort: Pulmonary effort is normal. No respiratory distress.     Breath sounds: Normal breath sounds. No wheezing or rales.  Musculoskeletal: Normal range of motion.        General: No tenderness.  Skin:    General: Skin is warm and dry.     Findings: No rash.  Neurological:     Mental Status: She is alert and oriented to person, place, and time.     Coordination: Coordination normal.  Psychiatric:        Mood and Affect: Mood is anxious and depressed.        Behavior: Behavior normal.        Thought Content: Thought content does not include suicidal ideation. Thought content does not  include suicidal plan.         Assessment & Plan:   Problem List Items Addressed This Visit      Respiratory   Asthma, moderate persistent   Relevant Medications   albuterol (PROVENTIL HFA;VENTOLIN HFA) 108 (90 Base) MCG/ACT inhaler   fluticasone furoate-vilanterol (BREO ELLIPTA) 100-25 MCG/INH AEPB     Other   Food allergy   Relevant Medications   EPINEPHrine 0.3 mg/0.3 mL IJ SOAJ injection   Depression with anxiety - Primary   Relevant Medications   escitalopram (LEXAPRO) 10 MG tablet   hydrOXYzine (ATARAX/VISTARIL) 50 MG tablet   Obesity (BMI 35.0-39.9 without comorbidity)      We will start her on Lexapro and give her hydroxyzine for breakthrough.  Refill her Brio that she has had previously and albuterol and will refill her Singulair as she needs it. Follow up plan: Return in about 4 weeks (around 07/09/2018), or if symptoms worsen or fail to improve, for anxiety.  Arville Care, MD [facility] 06/17/2018, 9:43 PM

## 2018-06-12 ENCOUNTER — Other Ambulatory Visit: Payer: Self-pay

## 2018-06-12 MED ORDER — MONTELUKAST SODIUM 10 MG PO TABS: 10.0000 mg | ORAL_TABLET | Freq: Every day | ORAL | 3 refills | Status: DC

## 2018-06-12 NOTE — Telephone Encounter (Signed)
Rx sent to wrong pharmacy - sent to correct pharmacy.

## 2018-06-13 ENCOUNTER — Telehealth: Payer: Self-pay | Admitting: Family Medicine

## 2018-06-13 MED ORDER — BUSPIRONE HCL 15 MG PO TABS: 15.0000 mg | ORAL_TABLET | Freq: Three times a day (TID) | ORAL | 1 refills | Status: DC

## 2018-06-13 NOTE — Telephone Encounter (Signed)
Tell her that I sent buspirone for her, she can take that and she could still use the hydroxyzine when she needs it.

## 2018-06-13 NOTE — Telephone Encounter (Signed)
Patient states that she found out lastnight that her grandfather had a heart attack. Patient states her grandfather didn't even know who she was when she went to see him.  Patient is having a hard time dealing with it and would like something fast acting to help with her anxiety until her lexapro  Kicks in.  She states that the hydroxyzine is not helping. Please advise and if approved [facility] to [facility] [place].

## 2018-06-14 NOTE — Telephone Encounter (Signed)
Patient aware and verbalizes understanding. 

## 2018-06-14 NOTE — Telephone Encounter (Signed)
lmtcb

## 2018-06-19 ENCOUNTER — Telehealth: Payer: Self-pay | Admitting: Family Medicine

## 2018-06-19 NOTE — Telephone Encounter (Signed)
Called patient to advise her shot record was ready to pickup

## 2018-07-03 ENCOUNTER — Other Ambulatory Visit: Payer: Self-pay | Admitting: Family Medicine

## 2018-07-03 DIAGNOSIS — F418 Other specified anxiety disorders: Secondary | ICD-10-CM

## 2018-07-05 ENCOUNTER — Ambulatory Visit (INDEPENDENT_AMBULATORY_CARE_PROVIDER_SITE_OTHER): Payer: Managed Care, Other (non HMO) | Admitting: Family Medicine

## 2018-07-05 ENCOUNTER — Encounter: Payer: Self-pay | Admitting: Family Medicine

## 2018-07-05 VITALS — BP 124/78 | HR 78 | Temp 98.1°F | Ht 62.0 in | Wt 210.0 lb

## 2018-07-05 DIAGNOSIS — F3181 Bipolar II disorder: Secondary | ICD-10-CM

## 2018-07-05 DIAGNOSIS — F418 Other specified anxiety disorders: Secondary | ICD-10-CM | POA: Diagnosis not present

## 2018-07-05 MED ORDER — ETONOGESTREL-ETHINYL ESTRADIOL 0.12-0.015 MG/24HR VA RING: VAGINAL_RING | VAGINAL | 12 refills | Status: DC

## 2018-07-05 MED ORDER — ARIPIPRAZOLE 5 MG PO TABS: 5.0000 mg | ORAL_TABLET | Freq: Every day | ORAL | 1 refills | Status: DC

## 2018-07-05 NOTE — Progress Notes (Signed)
BP 124/78   Pulse 78   Temp 98.1 F (36.7 C) (Oral)   Ht 5\' 2"  (1.575 m)   Wt 210 lb (95.3 kg)   BMI 38.41 kg/m    Subjective:    Patient ID: Nichole Guerra, female    DOB: March 09, 1996, 23 y.o.   MRN: [id number]  HPI: Nichole Guerra is a 23 y.o. female presenting on 07/05/2018 for Depression   HPI Depression and anxiety recheck Patient is coming in for depression anxiety recheck.  It sounds like she is doing better on the depression anxiety but she had an episode where she was up all night cleaning her bedroom all night with lots of energy and then she did crash the next day.  Now that we talked about it she does admit that she had some episodes while she was at college where she stayed up for a full weekend at a time and did not sleep.  There is a strong family history of bipolar in the family.  Patient denies any suicidal ideations or thoughts of hurting himself. Depression screen [facility] 2/9 07/05/2018 06/11/2018 02/02/2017 06/21/2016  Decreased Interest 0 1 0 0  Down, Depressed, Hopeless 0 1 0 0  PHQ - 2 Score 0 2 0 0  Altered sleeping 0 3 - -  Tired, decreased energy 1 2 - -  Change in appetite 0 1 - -  Feeling bad or failure about yourself  0 0 - -  Trouble concentrating 0 1 - -  Moving slowly or fidgety/restless 0 2 - -  Suicidal thoughts 0 0 - -  PHQ-9 Score 1 11 - -     Relevant past medical, surgical, family and social history reviewed and updated as indicated. Interim medical history since our last visit reviewed. Allergies and medications reviewed and updated.  Review of Systems  Constitutional: Negative for chills and fever.  Eyes: Negative for visual disturbance.  Respiratory: Negative for chest tightness and shortness of breath.   Cardiovascular: Negative for chest pain and leg swelling.  Musculoskeletal: Negative for back pain and gait problem.  Skin: Negative for rash.  Neurological: Negative for light-headedness and headaches.  Psychiatric/Behavioral: Positive for  decreased concentration, dysphoric mood and sleep disturbance. Negative for agitation, behavioral problems, self-injury and suicidal ideas. The patient is nervous/anxious.   All other systems reviewed and are negative.   Per HPI unless specifically indicated above        Objective:    BP 124/78   Pulse 78   Temp 98.1 F (36.7 C) (Oral)   Ht 5\' 2"  (1.575 m)   Wt 210 lb (95.3 kg)   BMI 38.41 kg/m   Wt Readings from Last 3 Encounters:  07/05/18 210 lb (95.3 kg)  06/11/18 208 lb 12.8 oz (94.7 kg)  03/09/17 208 lb 0.6 oz (94.4 kg)    Physical Exam Vitals signs and nursing note reviewed.  Constitutional:      General: She is not in acute distress.    Appearance: She is well-developed. She is not diaphoretic.  Eyes:     Conjunctiva/sclera: Conjunctivae normal.  Cardiovascular:     Rate and Rhythm: Normal rate and regular rhythm.     Heart sounds: Normal heart sounds. No murmur.  Pulmonary:     Effort: Pulmonary effort is normal. No respiratory distress.     Breath sounds: Normal breath sounds. No wheezing.  Musculoskeletal: Normal range of motion.        General: No tenderness.  Skin:  General: Skin is warm and dry.     Findings: No rash.  Neurological:     Mental Status: She is alert and oriented to person, place, and time.     Coordination: Coordination normal.  Psychiatric:        Mood and Affect: Mood is anxious and depressed.        Behavior: Behavior normal.        Thought Content: Thought content does not include suicidal ideation. Thought content does not include suicidal plan.        Assessment & Plan:   Problem List Items Addressed This Visit      Other   Depression with anxiety - Primary   Relevant Medications   ARIPiprazole (ABILIFY) 5 MG tablet   Bipolar 2 disorder (HCC)   Relevant Medications   ARIPiprazole (ABILIFY) 5 MG tablet      Will add Abilify because patient seems to have some possible hypomanic episodes.  Follow up plan: Return  in about 4 weeks (around 08/02/2018), or if symptoms worsen or fail to improve, for Depression and bipolar.  Counseling provided for all of the vaccine components No orders of the defined types were placed in this encounter.   Arville Care, MD [facility] 07/05/2018, 10:39 AM

## 2018-07-05 NOTE — Addendum Note (Signed)
Addended by: Caryl Bis on: 07/05/2018 04:06 PM   Modules accepted: Orders

## 2018-07-16 ENCOUNTER — Other Ambulatory Visit: Payer: Self-pay | Admitting: Adult Health

## 2018-07-29 ENCOUNTER — Other Ambulatory Visit: Payer: Self-pay | Admitting: Family Medicine

## 2018-07-29 DIAGNOSIS — F3181 Bipolar II disorder: Secondary | ICD-10-CM

## 2018-07-29 DIAGNOSIS — F418 Other specified anxiety disorders: Secondary | ICD-10-CM

## 2018-08-03 ENCOUNTER — Encounter: Payer: Self-pay | Admitting: Family Medicine

## 2018-08-03 ENCOUNTER — Ambulatory Visit (INDEPENDENT_AMBULATORY_CARE_PROVIDER_SITE_OTHER): Payer: Managed Care, Other (non HMO) | Admitting: Family Medicine

## 2018-08-03 ENCOUNTER — Encounter: Payer: Self-pay | Admitting: *Deleted

## 2018-08-03 ENCOUNTER — Other Ambulatory Visit: Payer: Self-pay

## 2018-08-03 VITALS — BP 118/84 | HR 94 | Temp 97.6°F | Ht 62.0 in | Wt 207.2 lb

## 2018-08-03 DIAGNOSIS — F3181 Bipolar II disorder: Secondary | ICD-10-CM | POA: Diagnosis not present

## 2018-08-03 DIAGNOSIS — F418 Other specified anxiety disorders: Secondary | ICD-10-CM | POA: Diagnosis not present

## 2018-08-03 MED ORDER — ARIPIPRAZOLE 10 MG PO TABS: 10.0000 mg | ORAL_TABLET | Freq: Every day | ORAL | 2 refills | Status: DC

## 2018-08-03 NOTE — Progress Notes (Signed)
BP 118/84   Pulse 94   Temp 97.6 F (36.4 C) (Oral)   Ht 5\' 2"  (1.575 m)   Wt 207 lb 3.2 oz (94 kg)   BMI 37.90 kg/m    Subjective:   Patient ID: Nichole Guerra, female    DOB: December 30, 1995, 23 y.o.   MRN: [id number]  HPI: Nichole Guerra is a 23 y.o. female presenting on 08/03/2018 for Depression (4 week re check- patient states she is still having trouble sleeping) and Anxiety   HPI Patient is coming in today for anxiety and bipolar recheck today.  She is currently taking Lexapro and uses hydroxyzine and Abilify.  She says the only real problem that she is still having is that she does stay up for a few days in row and she does not sleep and says she does not Nestl get tired and has lots of energy.  Patient denies any suicidal ideations or thoughts of hurting self.  She says otherwise she is doing very well and she feels like her job is doing better and she is a lot more stable currently. Depression screen [facility] 2/9 08/03/2018 07/05/2018 06/11/2018 02/02/2017 06/21/2016  Decreased Interest 1 0 1 0 0  Down, Depressed, Hopeless 0 0 1 0 0  PHQ - 2 Score 1 0 2 0 0  Altered sleeping - 0 3 - -  Tired, decreased energy - 1 2 - -  Change in appetite - 0 1 - -  Feeling bad or failure about yourself  - 0 0 - -  Trouble concentrating - 0 1 - -  Moving slowly or fidgety/restless - 0 2 - -  Suicidal thoughts - 0 0 - -  PHQ-9 Score - 1 11 - -     Relevant past medical, surgical, family and social history reviewed and updated as indicated. Interim medical history since our last visit reviewed. Allergies and medications reviewed and updated.  Review of Systems  Constitutional: Negative for chills and fever.  Eyes: Negative for visual disturbance.  Respiratory: Negative for chest tightness and shortness of breath.   Cardiovascular: Negative for chest pain and leg swelling.  Musculoskeletal: Negative for back pain and gait problem.  Skin: Negative for rash.  Neurological: Negative for light-headedness  and headaches.  Psychiatric/Behavioral: Positive for dysphoric mood and sleep disturbance. Negative for agitation, behavioral problems, self-injury and suicidal ideas. The patient is nervous/anxious.   All other systems reviewed and are negative.   Per HPI unless specifically indicated above   Allergies as of 08/03/2018      Reactions   Penicillins Anaphylaxis   Throat swells up.   Shellfish-derived Products Anaphylaxis   Bactrim [sulfamethoxazole-trimethoprim] Hives   Yellow Dyes (non-tartrazine) Rash      Medication List       Accurate as of August 03, 2018 11:20 AM. Always use your most recent med list.        albuterol 108 (90 Base) MCG/ACT inhaler Commonly known as:  PROVENTIL HFA;VENTOLIN HFA Inhale 2 puffs into the lungs every 6 (six) hours as needed for wheezing. Reported on 08/11/2015   ARIPiprazole 10 MG tablet Commonly known as:  ABILIFY Take 1 tablet (10 mg total) by mouth daily.   busPIRone 15 MG tablet Commonly known as:  BUSPAR Take 1 tablet (15 mg total) by mouth 3 (three) times daily.   cetirizine 10 MG tablet Commonly known as:  ZYRTEC Take 10 mg by mouth daily.   EPINEPHrine 0.3 mg/0.3 mL Soaj injection Commonly known  as:  EPI-PEN Inject 0.3 mLs (0.3 mg total) into the muscle as needed for anaphylaxis.   escitalopram 10 MG tablet Commonly known as:  LEXAPRO TAKE 1 TABLET BY MOUTH EVERY DAY   etonogestrel-ethinyl estradiol 0.12-0.015 MG/24HR vaginal ring Commonly known as:  NuvaRing Insert vaginally and leave in place for 3 consecutive weeks, then remove for 1 week.   fluticasone furoate-vilanterol 100-25 MCG/INH Aepb Commonly known as:  Breo Ellipta Inhale 1 puff into the lungs daily.   hydrOXYzine 50 MG tablet Commonly known as:  ATARAX/VISTARIL TAKE 1 TABLET BY MOUTH EVERY DAY   montelukast 10 MG tablet Commonly known as:  SINGULAIR Take 1 tablet (10 mg total) by mouth at bedtime.        Objective:   BP 118/84   Pulse 94   Temp  97.6 F (36.4 C) (Oral)   Ht 5\' 2"  (1.575 m)   Wt 207 lb 3.2 oz (94 kg)   BMI 37.90 kg/m   Wt Readings from Last 3 Encounters:  08/03/18 207 lb 3.2 oz (94 kg)  07/05/18 210 lb (95.3 kg)  06/11/18 208 lb 12.8 oz (94.7 kg)    Physical Exam Vitals signs and nursing note reviewed.  Constitutional:      General: She is not in acute distress.    Appearance: She is well-developed. She is not diaphoretic.  Eyes:     Conjunctiva/sclera: Conjunctivae normal.  Skin:    General: Skin is warm and dry.     Findings: No rash.  Neurological:     Mental Status: She is alert and oriented to person, place, and time.     Coordination: Coordination normal.  Psychiatric:        Mood and Affect: Mood is anxious and depressed.        Behavior: Behavior normal.        Thought Content: Thought content does not include suicidal ideation. Thought content does not include suicidal plan.      Assessment & Plan:   Problem List Items Addressed This Visit      Other   Depression with anxiety - Primary   Relevant Medications   ARIPiprazole (ABILIFY) 10 MG tablet   Bipolar 2 disorder (HCC)   Relevant Medications   ARIPiprazole (ABILIFY) 10 MG tablet       Follow up plan: Return in about 4 weeks (around 08/31/2018), or if symptoms worsen or fail to improve, for Depression and anxiety and bipolar recheck.  Counseling provided for all of the vaccine components No orders of the defined types were placed in this encounter.   Arville Care, MD [facility] 08/03/2018, 11:20 AM

## 2018-08-06 ENCOUNTER — Other Ambulatory Visit (HOSPITAL_COMMUNITY)
Admission: RE | Admit: 2018-08-06 | Discharge: 2018-08-06 | Disposition: A | Discharge status: Home / Self Care | Payer: Managed Care, Other (non HMO) | Source: Ambulatory Visit | Attending: Adult Health | Admitting: Adult Health

## 2018-08-06 ENCOUNTER — Ambulatory Visit (INDEPENDENT_AMBULATORY_CARE_PROVIDER_SITE_OTHER): Payer: Managed Care, Other (non HMO) | Admitting: Adult Health

## 2018-08-06 ENCOUNTER — Encounter: Payer: Self-pay | Admitting: Adult Health

## 2018-08-06 ENCOUNTER — Other Ambulatory Visit: Payer: Self-pay

## 2018-08-06 VITALS — BP 111/76 | HR 95 | Temp 97.6°F | Ht 61.0 in | Wt 211.0 lb

## 2018-08-06 DIAGNOSIS — Z01419 Encounter for gynecological examination (general) (routine) without abnormal findings: Secondary | ICD-10-CM | POA: Diagnosis present

## 2018-08-06 DIAGNOSIS — Z3202 Encounter for pregnancy test, result negative: Secondary | ICD-10-CM | POA: Diagnosis not present

## 2018-08-06 DIAGNOSIS — Z113 Encounter for screening for infections with a predominantly sexual mode of transmission: Secondary | ICD-10-CM

## 2018-08-06 LAB — POCT URINE PREGNANCY: Preg Test, Ur: NEGATIVE

## 2018-08-06 NOTE — Addendum Note (Signed)
Addended by: Federico Flake A on: 08/06/2018 03:10 PM   Modules accepted: Orders

## 2018-08-06 NOTE — Progress Notes (Signed)
Patient ID: Nichole Guerra, female   DOB: [birth year], 23 y.o.   MRN: [id number] History of Present Illness: Nichole Guerra is a 23 year old white female, single, G0P0, in for a well woman gyn exam and pap.She uses nuva ring and did not have period while it was out, but put new one in. PCP is Dr Louanne Skye.    Current Medications, Allergies, Past Medical History, Past Surgical History, Family History and Social History were reviewed in [name] record.     Review of Systems:  Patient denies any headaches, hearing loss, fatigue, blurred vision, shortness of breath, chest pain, abdominal pain, problems with bowel movements, urination, or intercourse. No joint pain or mood swings.   Physical Exam:BP 111/76 (BP Location: Left Arm, Patient Position: Sitting, Cuff Size: Large)   Pulse 95   Temp 97.6 F (36.4 C)   Ht 5\' 1"  (1.549 m)   Wt 211 lb (95.7 kg)   LMP 08/05/2018   BMI 39.87 kg/m UPT is negative  General:  Well developed, well nourished, no acute distress Skin:  Warm and dry Neck:  Midline trachea, normal thyroid, good ROM, no lymphadenopathy Lungs; Clear to auscultation bilaterally Breast:  No dominant palpable mass, retraction, or nipple discharge Cardiovascular: Regular rate and rhythm Abdomen:  Soft, non tender, no hepatosplenomegaly Pelvic:  External genitalia is normal in appearance, no lesions.  The vagina is normal in appearance.lite blood,nuva ring in vagina. Urethra has no lesions or masses. The cervix is smooth.Pap with GC/CHL performed.  Uterus is felt to be normal size, shape, and contour.  No adnexal masses or tenderness noted.Bladder is non tender, no masses felt. Extremities/musculoskeletal:  No swelling or varicosities noted, no clubbing or cyanosis Psych:  No mood changes, alert and cooperative,seems happy Fall risk is low. PHQ  9 score is 0. Examination chaperoned by Federico Flake CMA.   Impression: 1. Encounter for gynecological examination with  Papanicolaou smear of cervix   2. Pregnancy test negative   3. Screening examination for STD (sexually transmitted disease)       Plan: Check HIV and RPR Physical in 1 year Pap in 3 if normal

## 2018-08-07 LAB — RPR: RPR Ser Ql: NONREACTIVE

## 2018-08-07 LAB — HIV ANTIBODY (ROUTINE TESTING W REFLEX): HIV Screen 4th Generation wRfx: NONREACTIVE

## 2018-08-08 LAB — CYTOLOGY - PAP
Chlamydia: NEGATIVE
Diagnosis: NEGATIVE
NEISSERIA GONORRHEA: NEGATIVE

## 2018-08-27 ENCOUNTER — Other Ambulatory Visit: Payer: Self-pay | Admitting: Family Medicine

## 2018-08-27 DIAGNOSIS — F3181 Bipolar II disorder: Secondary | ICD-10-CM

## 2018-08-27 DIAGNOSIS — F418 Other specified anxiety disorders: Secondary | ICD-10-CM

## 2018-09-05 ENCOUNTER — Encounter: Payer: Self-pay | Admitting: Family Medicine

## 2018-09-05 ENCOUNTER — Other Ambulatory Visit: Payer: Self-pay

## 2018-09-05 ENCOUNTER — Ambulatory Visit (INDEPENDENT_AMBULATORY_CARE_PROVIDER_SITE_OTHER): Payer: Managed Care, Other (non HMO) | Admitting: Family Medicine

## 2018-09-05 DIAGNOSIS — F3181 Bipolar II disorder: Secondary | ICD-10-CM | POA: Diagnosis not present

## 2018-09-05 DIAGNOSIS — F418 Other specified anxiety disorders: Secondary | ICD-10-CM

## 2018-09-05 MED ORDER — BUSPIRONE HCL 15 MG PO TABS: 15.0000 mg | ORAL_TABLET | Freq: Three times a day (TID) | ORAL | 1 refills | Status: DC

## 2018-09-05 MED ORDER — ESCITALOPRAM OXALATE 10 MG PO TABS: 10.0000 mg | ORAL_TABLET | Freq: Every day | ORAL | 1 refills | Status: DC

## 2018-09-05 MED ORDER — ARIPIPRAZOLE 10 MG PO TABS: 10.0000 mg | ORAL_TABLET | Freq: Every day | ORAL | 1 refills | Status: DC

## 2018-09-05 NOTE — Progress Notes (Signed)
Virtual Visit via telephone Note  I connected with Nichole Guerra on 09/05/18 at 1448 by telephone and verified that I am speaking with the correct person using two identifiers. Nichole Guerra is currently located at home and no other people are currently with her during visit. The provider, Elige Radon Tiersa Dayley, MD is located in their office at time of visit.  Call ended at 1455  I discussed the limitations, risks, security and privacy concerns of performing an evaluation and management service by telephone and the availability of in person appointments. I also discussed with the patient that there may be a patient responsible charge related to this service. The patient expressed understanding and agreed to proceed.   History and Present Illness: Bipolar and depression Patient is now sleeping better, and her relationship with her boyfriend is better. She is pretty happy with her current medication.  She is currently receiving unemployment. She feels like things are going a lot better and she is sleeping every night and she says except for the fact that the medication is very sedating and she has to make sure she is ready to go to sleep before she can take it she denies any side effects.  Patient denies any suicidal ideations or thoughts of hurting herself.  She denies any major manic or depressive episodes.  No diagnosis found.  Outpatient Encounter Medications as of 09/05/2018  Medication Sig  . albuterol (PROVENTIL HFA;VENTOLIN HFA) 108 (90 Base) MCG/ACT inhaler Inhale 2 puffs into the lungs every 6 (six) hours as needed for wheezing. Reported on 08/11/2015  . ARIPiprazole (ABILIFY) 10 MG tablet TAKE 1 TABLET BY MOUTH EVERY DAY  . busPIRone (BUSPAR) 15 MG tablet Take 1 tablet (15 mg total) by mouth 3 (three) times daily.  . cetirizine (ZYRTEC) 10 MG tablet Take 10 mg by mouth daily.  Marland Kitchen EPINEPHrine 0.3 mg/0.3 mL IJ SOAJ injection Inject 0.3 mLs (0.3 mg total) into the muscle as needed for  anaphylaxis.  Marland Kitchen escitalopram (LEXAPRO) 10 MG tablet TAKE 1 TABLET BY MOUTH EVERY DAY  . etonogestrel-ethinyl estradiol (NUVARING) 0.12-0.015 MG/24HR vaginal ring Insert vaginally and leave in place for 3 consecutive weeks, then remove for 1 week.  . fluticasone furoate-vilanterol (BREO ELLIPTA) 100-25 MCG/INH AEPB Inhale 1 puff into the lungs daily.  . hydrOXYzine (ATARAX/VISTARIL) 50 MG tablet TAKE 1 TABLET BY MOUTH EVERY DAY  . montelukast (SINGULAIR) 10 MG tablet Take 1 tablet (10 mg total) by mouth at bedtime.   No facility-administered encounter medications on file as of 09/05/2018.     Review of Systems  Constitutional: Negative for chills and fever.  Respiratory: Negative for chest tightness and shortness of breath.   Cardiovascular: Negative for chest pain and leg swelling.  Psychiatric/Behavioral: Negative for agitation, behavioral problems, decreased concentration, dysphoric mood, self-injury, sleep disturbance and suicidal ideas. The patient is not nervous/anxious.   All other systems reviewed and are negative.   Observations/Objective: Patient sounds comfortable and in no acute distress  Assessment and Plan: Problem List Items Addressed This Visit      Other   Depression with anxiety - Primary   Relevant Medications   ARIPiprazole (ABILIFY) 10 MG tablet   busPIRone (BUSPAR) 15 MG tablet   escitalopram (LEXAPRO) 10 MG tablet   Bipolar 2 disorder (HCC)   Relevant Medications   ARIPiprazole (ABILIFY) 10 MG tablet    Continue Abilify 10 and buspirone and Lexapro  Follow Up Instructions:  Recheck in 3 months   I discussed the assessment  and treatment plan with the patient. The patient was provided an opportunity to ask questions and all were answered. The patient agreed with the plan and demonstrated an understanding of the instructions.   The patient was advised to call back or seek an in-person evaluation if the symptoms worsen or if the condition fails to improve as  anticipated.  The above assessment and management plan was discussed with the patient. The patient verbalized understanding of and has agreed to the management plan. Patient is aware to call the clinic if symptoms persist or worsen. Patient is aware when to return to the clinic for a follow-up visit. Patient educated on when it is appropriate to go to the emergency department.    I provided 7 minutes of non-face-to-face time during this encounter.    Nils PyleJoshua A Fleming Prill, MD

## 2018-09-15 ENCOUNTER — Other Ambulatory Visit: Payer: Self-pay | Admitting: Family Medicine

## 2018-09-15 DIAGNOSIS — F418 Other specified anxiety disorders: Secondary | ICD-10-CM

## 2018-09-26 ENCOUNTER — Other Ambulatory Visit: Payer: Self-pay | Admitting: Family Medicine

## 2018-11-24 ENCOUNTER — Encounter (HOSPITAL_COMMUNITY): Payer: Self-pay | Admitting: Emergency Medicine

## 2018-11-24 ENCOUNTER — Other Ambulatory Visit: Payer: Self-pay

## 2018-11-24 ENCOUNTER — Emergency Department (HOSPITAL_COMMUNITY)
Admission: EM | Admit: 2018-11-24 | Discharge: 2018-11-24 | Disposition: A | Discharge status: Home / Self Care | Payer: Managed Care, Other (non HMO) | Attending: Emergency Medicine | Admitting: Emergency Medicine

## 2018-11-24 ENCOUNTER — Encounter: Payer: Self-pay | Admitting: Family Medicine

## 2018-11-24 DIAGNOSIS — J45909 Unspecified asthma, uncomplicated: Secondary | ICD-10-CM | POA: Insufficient documentation

## 2018-11-24 DIAGNOSIS — K2100 Gastro-esophageal reflux disease with esophagitis, without bleeding: Secondary | ICD-10-CM

## 2018-11-24 DIAGNOSIS — R1013 Epigastric pain: Secondary | ICD-10-CM | POA: Diagnosis not present

## 2018-11-24 DIAGNOSIS — R112 Nausea with vomiting, unspecified: Secondary | ICD-10-CM | POA: Diagnosis present

## 2018-11-24 DIAGNOSIS — Z880 Allergy status to penicillin: Secondary | ICD-10-CM | POA: Diagnosis not present

## 2018-11-24 DIAGNOSIS — R197 Diarrhea, unspecified: Secondary | ICD-10-CM | POA: Diagnosis not present

## 2018-11-24 DIAGNOSIS — Z79899 Other long term (current) drug therapy: Secondary | ICD-10-CM | POA: Diagnosis not present

## 2018-11-24 LAB — CBC WITH DIFFERENTIAL/PLATELET
Abs Immature Granulocytes: 0.16 10*3/uL — ABNORMAL HIGH (ref 0.00–0.07)
Basophils Absolute: 0.1 10*3/uL (ref 0.0–0.1)
Basophils Relative: 1 %
Eosinophils Absolute: 0 10*3/uL (ref 0.0–0.5)
Eosinophils Relative: 0 %
HCT: 42.3 % (ref 36.0–46.0)
Hemoglobin: 14.2 g/dL (ref 12.0–15.0)
Immature Granulocytes: 1 %
Lymphocytes Relative: 10 %
Lymphs Abs: 1.7 10*3/uL (ref 0.7–4.0)
MCH: 27.3 pg (ref 26.0–34.0)
MCHC: 33.6 g/dL (ref 30.0–36.0)
MCV: 81.3 fL (ref 80.0–100.0)
Monocytes Absolute: 0.7 10*3/uL (ref 0.1–1.0)
Monocytes Relative: 4 %
Neutro Abs: 14.3 10*3/uL — ABNORMAL HIGH (ref 1.7–7.7)
Neutrophils Relative %: 84 %
Platelets: 406 10*3/uL — ABNORMAL HIGH (ref 150–400)
RBC: 5.2 MIL/uL — ABNORMAL HIGH (ref 3.87–5.11)
RDW: 13.9 % (ref 11.5–15.5)
WBC: 16.9 10*3/uL — ABNORMAL HIGH (ref 4.0–10.5)
nRBC: 0 % (ref 0.0–0.2)

## 2018-11-24 LAB — COMPREHENSIVE METABOLIC PANEL
ALT: 29 U/L (ref 0–44)
AST: 25 U/L (ref 15–41)
Albumin: 4.1 g/dL (ref 3.5–5.0)
Alkaline Phosphatase: 62 U/L (ref 38–126)
Anion gap: 12 (ref 5–15)
BUN: 6 mg/dL (ref 6–20)
CO2: 19 mmol/L — ABNORMAL LOW (ref 22–32)
Calcium: 8.8 mg/dL — ABNORMAL LOW (ref 8.9–10.3)
Chloride: 107 mmol/L (ref 98–111)
Creatinine, Ser: 0.76 mg/dL (ref 0.44–1.00)
GFR calc Af Amer: >60 mL/min (ref >60)
GFR calc non Af Amer: >60 mL/min (ref >60)
Glucose, Bld: 125 mg/dL — ABNORMAL HIGH (ref 70–99)
Potassium: 3.5 mmol/L (ref 3.5–5.1)
Sodium: 138 mmol/L (ref 135–145)
Total Bilirubin: 0.6 mg/dL (ref 0.3–1.2)
Total Protein: 7.9 g/dL (ref 6.5–8.1)

## 2018-11-24 LAB — LIPASE, BLOOD: Lipase: 29 U/L (ref 11–51)

## 2018-11-24 LAB — URINALYSIS, ROUTINE W REFLEX MICROSCOPIC
Bacteria, UA: NONE SEEN
Bilirubin Urine: NEGATIVE
Glucose, UA: NEGATIVE mg/dL
Ketones, ur: NEGATIVE mg/dL
Leukocytes,Ua: NEGATIVE
Nitrite: NEGATIVE
Protein, ur: NEGATIVE mg/dL
Specific Gravity, Urine: 1.019 (ref 1.005–1.030)
pH: 6 (ref 5.0–8.0)

## 2018-11-24 LAB — PREGNANCY, URINE: Preg Test, Ur: NEGATIVE

## 2018-11-24 MED ORDER — DICYCLOMINE HCL 10 MG/ML IM SOLN
20.0000 mg | Freq: Once | INTRAMUSCULAR | Status: AC
  Administered 2018-11-24: 20 mg via INTRAMUSCULAR
  Filled 2018-11-24: qty 2

## 2018-11-24 MED ORDER — ESOMEPRAZOLE MAGNESIUM 40 MG PO CPDR: 40.0000 mg | DELAYED_RELEASE_CAPSULE | Freq: Every day | ORAL | 0 refills | Status: DC

## 2018-11-24 MED ORDER — ONDANSETRON 4 MG PO TBDP: 4.0000 mg | ORAL_TABLET | Freq: Three times a day (TID) | ORAL | 0 refills | Status: DC | PRN

## 2018-11-24 MED ORDER — ONDANSETRON HCL 4 MG/2ML IJ SOLN
4.0000 mg | Freq: Once | INTRAMUSCULAR | Status: AC
  Administered 2018-11-24: 4 mg via INTRAVENOUS
  Filled 2018-11-24: qty 2

## 2018-11-24 MED ORDER — DICYCLOMINE HCL 20 MG PO TABS: 20.0000 mg | ORAL_TABLET | Freq: Two times a day (BID) | ORAL | 0 refills | Status: DC | PRN

## 2018-11-24 MED ORDER — LIDOCAINE VISCOUS HCL 2 % MT SOLN
15.0000 mL | Freq: Once | OROMUCOSAL | Status: AC
  Administered 2018-11-24: 15 mL via ORAL
  Filled 2018-11-24: qty 15

## 2018-11-24 MED ORDER — ALUM & MAG HYDROXIDE-SIMETH 200-200-20 MG/5ML PO SUSP
30.0000 mL | Freq: Once | ORAL | Status: AC
  Administered 2018-11-24: 30 mL via ORAL
  Filled 2018-11-24: qty 30

## 2018-11-24 MED ORDER — SODIUM CHLORIDE 0.9 % IV BOLUS
1000.0000 mL | Freq: Once | INTRAVENOUS | Status: AC
  Administered 2018-11-24: 1000 mL via INTRAVENOUS

## 2018-11-24 NOTE — Discharge Instructions (Signed)
It was my pleasure taking care of you today!   Zofran as needed for nausea. Bentyl as needed for abdominal pain.  Take nexium daily for the next week.  It is VERY important that you monitor your symptoms and return to the Emergency Department if you develop any of the following symptoms:  You have a fever.  You keep throwing up and can't keep fluids down.  You pass bloody or black tarry stools.  There is bright red blood in the stool. New or worsening symptoms develop.  You have any questions or concerns.

## 2018-11-24 NOTE — ED Provider Notes (Signed)
[facility] EMERGENCY DEPARTMENT Provider Note   CSN: [id number] Arrival date & time: 11/24/18  1153    History   Chief Complaint Chief Complaint  Patient presents with  . Emesis    HPI Nichole Glassmanngel Gaccione is a 23 y.o. female.     The history is provided by the patient and medical records. No language interpreter was used.  Emesis Associated symptoms: abdominal pain and diarrhea    Nichole Guerra is a 23 y.o. female  with a PMH as listed below who presents to the Emergency Department complaining of cute onset of upper abdominal pain which began about 9 PM last night.  Patient reports this began shortly after she ate fast food.  It was nothing that she has never had before.  She does report history of food poisoning in the past that felt similar to this however.  She reports over 50 episodes of emesis since 9 PM.  She has had several loose stools as well.  Denies fever, back pain, chest pain or shortness of breath.  No vaginal discharge or urinary symptoms.  She tried to Phenergan at home which did not help too much.  She also tried some Gas-X which provided little relief.   Past Medical History:  Diagnosis Date  . Allergy   . Anxiety   . Asthma   . Cardiac arrest (HCC)   . Contraceptive management 12/17/2012  . Depression     Patient Active Problem List   Diagnosis Date Noted  . Bipolar 2 disorder (HCC) 07/05/2018  . Asthma, moderate persistent 02/02/2017  . Environmental allergies 02/02/2017  . Food allergy 02/02/2017  . Depression with anxiety 02/02/2017  . Obesity (BMI 35.0-39.9 without comorbidity) 02/02/2017  . Contraceptive management 12/17/2012    History reviewed. No pertinent surgical history.   OB History    Gravida  0   Para  0   Term  0   Preterm  0   AB  0   Living  0     SAB  0   TAB  0   Ectopic  0   Multiple  0   Live Births               Home Medications    Prior to Admission medications   Medication Sig Start Date End Date  Taking? Authorizing Provider  albuterol (PROVENTIL HFA;VENTOLIN HFA) 108 (90 Base) MCG/ACT inhaler Inhale 2 puffs into the lungs every 6 (six) hours as needed for wheezing. Reported on 08/11/2015 06/11/18  Yes Dettinger, Elige RadonJoshua A, MD  ARIPiprazole (ABILIFY) 10 MG tablet Take 1 tablet (10 mg total) by mouth daily. 09/05/18  Yes Dettinger, Elige RadonJoshua A, MD  busPIRone (BUSPAR) 15 MG tablet Take 1 tablet (15 mg total) by mouth 3 (three) times daily. 09/05/18  Yes Dettinger, Elige RadonJoshua A, MD  cetirizine (ZYRTEC) 10 MG tablet Take 10 mg by mouth daily.   Yes [provider]  escitalopram (LEXAPRO) 10 MG tablet Take 1 tablet (10 mg total) by mouth daily. 09/05/18  Yes Dettinger, Elige RadonJoshua A, MD  etonogestrel-ethinyl estradiol (NUVARING) 0.12-0.015 MG/24HR vaginal ring Insert vaginally and leave in place for 3 consecutive weeks, then remove for 1 week. 07/05/18  Yes Dettinger, Elige RadonJoshua A, MD  fluticasone furoate-vilanterol (BREO ELLIPTA) 100-25 MCG/INH AEPB Inhale 1 puff into the lungs daily. 06/11/18  Yes Dettinger, Elige RadonJoshua A, MD  hydrOXYzine (ATARAX/VISTARIL) 50 MG tablet TAKE 1 TABLET BY MOUTH EVERY DAY 09/17/18  Yes Dettinger, Elige RadonJoshua A, MD  montelukast (SINGULAIR) 10  MG tablet TAKE 1 TABLET BY MOUTH EVERYDAY AT BEDTIME 09/26/18  Yes Dettinger, Fransisca Kaufmann, MD  dicyclomine (BENTYL) 20 MG tablet Take 1 tablet (20 mg total) by mouth 2 (two) times daily as needed (Abdominal cramping). 11/24/18   Cayetano Mikita, Ozella Almond, PA-C  EPINEPHrine 0.3 mg/0.3 mL IJ SOAJ injection Inject 0.3 mLs (0.3 mg total) into the muscle as needed for anaphylaxis. 06/11/18   Dettinger, Fransisca Kaufmann, MD  esomeprazole (NEXIUM) 40 MG capsule Take 1 capsule (40 mg total) by mouth daily. 11/24/18   Odies Desa, Ozella Almond, PA-C  ondansetron (ZOFRAN ODT) 4 MG disintegrating tablet Take 1 tablet (4 mg total) by mouth every 8 (eight) hours as needed for nausea or vomiting. 11/24/18   Alivya Wegman, Ozella Almond, PA-C    Family History Family History  Problem Relation Age of  Onset  . Hypertension Mother   . Other Mother        pre-cancerous cells on cervix  . Diabetes Father   . Miscarriages / Stillbirths Maternal Grandmother   . Hypertension Maternal Grandmother   . Heart disease Maternal Grandfather   . Stroke Paternal Grandfather   . Hyperlipidemia Paternal Grandmother   . Asthma Paternal Grandmother   . COPD Paternal Grandmother   . Stroke Other        paternal great grandma  . Cancer Maternal Uncle        prostate, bladder, bone  . Cancer Paternal Uncle        lung    Social History Social History   Tobacco Use  . Smoking status: Never Smoker  . Smokeless tobacco: Never Used  Substance Use Topics  . Alcohol use: No    Comment: social infrequent  . Drug use: No     Allergies   Penicillins, Shellfish-derived products, Bactrim [sulfamethoxazole-trimethoprim], Hydrocodone, and Yellow dyes (non-tartrazine)   Review of Systems Review of Systems  Gastrointestinal: Positive for abdominal pain, diarrhea, nausea and vomiting. Negative for constipation.  All other systems reviewed and are negative.    Physical Exam Updated Vital Signs BP (!) 136/99 (BP Location: Left Arm)   Pulse (!) 59   Temp (!) 97.1 F (36.2 C) (Oral)   Resp 18   Ht 5\' 1"  (1.549 m)   Wt 104.3 kg   LMP 10/10/2018 (Approximate)   SpO2 99%   BMI 43.46 kg/m   Physical Exam Vitals signs and nursing note reviewed.  Constitutional:      General: She is not in acute distress.    Appearance: She is well-developed.  HENT:     Head: Normocephalic and atraumatic.  Neck:     Musculoskeletal: Neck supple.  Cardiovascular:     Rate and Rhythm: Normal rate and regular rhythm.     Heart sounds: Normal heart sounds. No murmur.  Pulmonary:     Effort: Pulmonary effort is normal. No respiratory distress.     Breath sounds: Normal breath sounds.  Abdominal:     General: There is no distension.     Palpations: Abdomen is soft.     Comments: Mild upper abdominal  tenderness without rebound or guarding.  Negative Murphy's.  No focal tenderness at McBurney's.  No CVA or flank tenderness.  Skin:    General: Skin is warm and dry.  Neurological:     Mental Status: She is alert and oriented to person, place, and time.      ED Treatments / Results  Labs (all labs ordered are listed, but only abnormal results are displayed)  Labs Reviewed  CBC WITH DIFFERENTIAL/PLATELET - Abnormal; Notable for the following components:      Result Value   WBC 16.9 (*)    RBC 5.20 (*)    Platelets 406 (*)    Neutro Abs 14.3 (*)    Abs Immature Granulocytes 0.16 (*)    All other components within normal limits  COMPREHENSIVE METABOLIC PANEL - Abnormal; Notable for the following components:   CO2 19 (*)    Glucose, Bld 125 (*)    Calcium 8.8 (*)    All other components within normal limits  URINALYSIS, ROUTINE W REFLEX MICROSCOPIC - Abnormal; Notable for the following components:   Hgb urine dipstick MODERATE (*)    All other components within normal limits  LIPASE, BLOOD  PREGNANCY, URINE  POC URINE PREG, ED    EKG None  Radiology No results found.  Procedures Procedures (including critical care time)  Medications Ordered in ED Medications  sodium chloride 0.9 % bolus 1,000 mL (0 mLs Intravenous Stopped 11/24/18 1450)  ondansetron (ZOFRAN) injection 4 mg (4 mg Intravenous Given 11/24/18 1243)  dicyclomine (BENTYL) injection 20 mg (20 mg Intramuscular Given 11/24/18 1310)  alum & mag hydroxide-simeth (MAALOX/MYLANTA) 200-200-20 MG/5ML suspension 30 mL (30 mLs Oral Given 11/24/18 1450)    And  lidocaine (XYLOCAINE) 2 % viscous mouth solution 15 mL (15 mLs Oral Given 11/24/18 1450)     Initial Impression / Assessment and Plan / ED Course  I have reviewed the triage vital signs and the nursing notes.  Pertinent labs & imaging results that were available during my care of the patient were reviewed by me and considered in my medical decision making (see  chart for details).       Nichole Glassmanngel Smithers is a 23 y.o. female who presents to ED for upper abdominal pain, nausea, vomiting and diarrhea which began last night. On exam, patient is nontoxic appearing with a non-surgical abdominal exam.  She does have tenderness to the epigastrium.  Negative Murphy's.  Given Zofran, Bentyl and a liter of fluids.  On reevaluation, feels much improved.  Has not had any further emesis since medication administration.  Was having some burning epigastric pain, therefore GI cocktail was given.  Urinalysis without signs of infection.  Labs reviewed and patient does have leukocytosis at 16.9.  Remainder of labs are reassuring.  Repeat abdominal exam again with epigastric tenderness, but much improved.  Initial plan was for ultrasound, however ultrasound not available at facility at this time.  I discussed this with patient.  Her tenderness is much more epigastric and right upper quadrant in nature.  She has a negative Murphy's.  She is afebrile.  She feels much better. No indication for appendicitis, bowel obstruction/perforation, diverticulitis, PID or ectopic.  Doubt cholecystitis, however I did discuss this is possible.  Options discussed with patient including CT scan to further evaluate.  We discussed risks and benefits of this as well as home with close PCP follow-up for recheck abdominal exam and very low threshold to return to the emergency department should her symptoms worsen.  She feels much better and would like to go home.  Feel this is reasonable. Patient discharged home with symptomatic treatment and encouraged to follow up with PCP. I have also discussed reasons to return immediately to the ER with her at length. Patient expresses understanding and agrees with plan as dictated above.   Final Clinical Impressions(s) / ED Diagnoses   Final diagnoses:  Epigastric pain  Nausea vomiting  and diarrhea    ED Discharge Orders         Ordered    ondansetron (ZOFRAN ODT) 4  MG disintegrating tablet  Every 8 hours PRN     11/24/18 1619    dicyclomine (BENTYL) 20 MG tablet  2 times daily PRN     11/24/18 1619    esomeprazole (NEXIUM) 40 MG capsule  Daily     11/24/18 1619           Etty Isaac, Chase PicketJaime Pilcher, PA-C 11/24/18 1642    Vanetta MuldersZackowski, Scott, MD 11/25/18 [phone]

## 2018-11-24 NOTE — ED Triage Notes (Signed)
Vomiting since 2100 last night.  Unsure if she is pregnant

## 2018-11-26 MED ORDER — FAMOTIDINE 20 MG PO TABS: 20.0000 mg | ORAL_TABLET | Freq: Two times a day (BID) | ORAL | 3 refills | Status: DC

## 2018-11-29 ENCOUNTER — Encounter: Payer: Self-pay | Admitting: Gastroenterology

## 2018-12-18 ENCOUNTER — Other Ambulatory Visit: Payer: Self-pay | Admitting: Family Medicine

## 2018-12-18 DIAGNOSIS — F418 Other specified anxiety disorders: Secondary | ICD-10-CM

## 2019-01-06 ENCOUNTER — Other Ambulatory Visit: Payer: Self-pay | Admitting: Family Medicine

## 2019-01-06 DIAGNOSIS — F418 Other specified anxiety disorders: Secondary | ICD-10-CM

## 2019-01-10 ENCOUNTER — Encounter: Payer: Self-pay | Admitting: Gastroenterology

## 2019-01-10 ENCOUNTER — Telehealth: Payer: Self-pay | Admitting: Gastroenterology

## 2019-01-10 ENCOUNTER — Ambulatory Visit: Payer: Managed Care, Other (non HMO) | Admitting: Nurse Practitioner

## 2019-01-10 NOTE — Progress Notes (Deleted)
Primary Care Physician:  Dettinger, Elige RadonJoshua A, MD Primary Gastroenterologist:  Dr. Darrick PennaFields  No chief complaint on file.   HPI:   Nichole Guerra is a 23 y.o. female who presents on referral from primary care for reflux.  Reviewed information provided with referral including ***.  No history of endoscopy in our system.  Today she states   Past Medical History:  Diagnosis Date  . Allergy   . Anxiety   . Asthma   . Cardiac arrest (HCC)   . Contraceptive management 12/17/2012  . Depression     No past surgical history on file.  Current Outpatient Medications  Medication Sig Dispense Refill  . albuterol (PROVENTIL HFA;VENTOLIN HFA) 108 (90 Base) MCG/ACT inhaler Inhale 2 puffs into the lungs every 6 (six) hours as needed for wheezing. Reported on 08/11/2015 1 Inhaler 1  . ARIPiprazole (ABILIFY) 10 MG tablet Take 1 tablet (10 mg total) by mouth daily. 90 tablet 1  . busPIRone (BUSPAR) 15 MG tablet Take 1 tablet (15 mg total) by mouth 3 (three) times daily. 90 tablet 1  . cetirizine (ZYRTEC) 10 MG tablet Take 10 mg by mouth daily.    Marland Kitchen. dicyclomine (BENTYL) 20 MG tablet Take 1 tablet (20 mg total) by mouth 2 (two) times daily as needed (Abdominal cramping). 20 tablet 0  . EPINEPHrine 0.3 mg/0.3 mL IJ SOAJ injection Inject 0.3 mLs (0.3 mg total) into the muscle as needed for anaphylaxis. 1 Device 1  . escitalopram (LEXAPRO) 10 MG tablet Take 1 tablet (10 mg total) by mouth daily. 90 tablet 1  . esomeprazole (NEXIUM) 40 MG capsule Take 1 capsule (40 mg total) by mouth daily. 30 capsule 0  . etonogestrel-ethinyl estradiol (NUVARING) 0.12-0.015 MG/24HR vaginal ring Insert vaginally and leave in place for 3 consecutive weeks, then remove for 1 week. 1 each 12  . famotidine (PEPCID) 20 MG tablet Take 1 tablet (20 mg total) by mouth 2 (two) times daily. 60 tablet 3  . fluticasone furoate-vilanterol (BREO ELLIPTA) 100-25 MCG/INH AEPB Inhale 1 puff into the lungs daily. 30 each 5  . hydrOXYzine  (ATARAX/VISTARIL) 50 MG tablet Take 1 tablet (50 mg total) by mouth daily. (Please make your 3 mos appt) 30 tablet 0  . montelukast (SINGULAIR) 10 MG tablet TAKE 1 TABLET BY MOUTH EVERYDAY AT BEDTIME 90 tablet 3  . ondansetron (ZOFRAN ODT) 4 MG disintegrating tablet Take 1 tablet (4 mg total) by mouth every 8 (eight) hours as needed for nausea or vomiting. 20 tablet 0   No current facility-administered medications for this visit.     Allergies as of 01/10/2019 - Review Complete 11/24/2018  Allergen Reaction Noted  . Penicillins Anaphylaxis 07/07/2014  . Shellfish-derived products Anaphylaxis 02/22/2016  . Bactrim [sulfamethoxazole-trimethoprim] Hives 09/21/2012  . Hydrocodone Hives 11/24/2018  . Yellow dyes (non-tartrazine) Rash 02/22/2016    Family History  Problem Relation Age of Onset  . Hypertension Mother   . Other Mother        pre-cancerous cells on cervix  . Diabetes Father   . Miscarriages / Stillbirths Maternal Grandmother   . Hypertension Maternal Grandmother   . Heart disease Maternal Grandfather   . Stroke Paternal Grandfather   . Hyperlipidemia Paternal Grandmother   . Asthma Paternal Grandmother   . COPD Paternal Grandmother   . Stroke Other        paternal great grandma  . Cancer Maternal Uncle        prostate, bladder, bone  . Cancer  Paternal Uncle        lung    Social History   Socioeconomic History  . Marital status: Single    Spouse name: Not on file  . Number of children: Not on file  . Years of education: 50- GED  . Highest education level: Not on file  Occupational History  . Occupation: dietary     Employer: [employer]    Comment: [employer]   Social Needs  . Financial resource strain: Not on file  . Food insecurity    Worry: Not on file    Inability: Not on file  . Transportation needs    Medical: Not on file    Non-medical: Not on file  Tobacco Use  . Smoking status: Never Smoker  . Smokeless tobacco: Never Used   Substance and Sexual Activity  . Alcohol use: No    Comment: social infrequent  . Drug use: No  . Sexual activity: Yes    Birth control/protection: Inserts    Comment: same female partner for 6 years  Lifestyle  . Physical activity    Days per week: Not on file    Minutes per session: Not on file  . Stress: Not on file  Relationships  . Social Herbalist on phone: Not on file    Gets together: Not on file    Attends religious service: Not on file    Active member of club or organization: Not on file    Attends meetings of clubs or organizations: Not on file    Relationship status: Not on file  . Intimate partner violence    Fear of current or ex partner: Not on file    Emotionally abused: Not on file    Physically abused: Not on file    Forced sexual activity: Not on file  Other Topics Concern  . Not on file  Social History Narrative   Lives with parents   Younger brother Legrand Como in the home   Artistic, likes to craft and draw   Likes outdoors    Review of Systems: Complete ROS negative except as per HPI.    Physical Exam: There were no vitals taken for this visit. General:   Alert and oriented. Pleasant and cooperative. Well-nourished and well-developed.  Head:  Normocephalic and atraumatic. Eyes:  Without icterus, sclera clear and conjunctiva pink.  Ears:  Normal auditory acuity. Mouth:  No deformity or lesions, oral mucosa pink.  Throat/Neck:  Supple, without mass or thyromegaly. Cardiovascular:  S1, S2 present without murmurs appreciated. Normal pulses noted. Extremities without clubbing or edema. Respiratory:  Clear to auscultation bilaterally. No wheezes, rales, or rhonchi. No distress.  Gastrointestinal:  +BS, soft, non-tender and non-distended. No HSM noted. No guarding or rebound. No masses appreciated.  Rectal:  Deferred  Musculoskalatal:  Symmetrical without gross deformities. Normal posture. Skin:  Intact without significant lesions or rashes.  Neurologic:  Alert and oriented x4;  grossly normal neurologically. Psych:  Alert and cooperative. Normal mood and affect. Heme/Lymph/Immune: No significant cervical adenopathy. No excessive bruising noted.    01/10/2019 7:48 AM   Disclaimer: This note was dictated with voice recognition software. Similar sounding words can inadvertently be transcribed and may not be corrected upon review.

## 2019-01-10 NOTE — Telephone Encounter (Signed)
Patient was a no show and letter sent  °

## 2019-02-09 ENCOUNTER — Other Ambulatory Visit: Payer: Self-pay | Admitting: Family Medicine

## 2019-02-18 ENCOUNTER — Other Ambulatory Visit: Payer: Self-pay | Admitting: Family Medicine

## 2019-02-18 DIAGNOSIS — K2100 Gastro-esophageal reflux disease with esophagitis, without bleeding: Secondary | ICD-10-CM

## 2019-03-18 ENCOUNTER — Encounter: Payer: Self-pay | Admitting: Family Medicine

## 2019-03-21 ENCOUNTER — Ambulatory Visit (INDEPENDENT_AMBULATORY_CARE_PROVIDER_SITE_OTHER): Payer: Managed Care, Other (non HMO) | Admitting: Family Medicine

## 2019-03-21 NOTE — Progress Notes (Signed)
Attempted to call patient twice and she did not answer, left a message and no call back. Caryl Pina, MD [facility] 03/21/2019, 3:54 PM

## 2019-04-17 ENCOUNTER — Ambulatory Visit: Payer: Managed Care, Other (non HMO) | Admitting: Family Medicine

## 2019-05-01 ENCOUNTER — Other Ambulatory Visit: Payer: Self-pay | Admitting: Family Medicine

## 2019-05-01 DIAGNOSIS — J4540 Moderate persistent asthma, uncomplicated: Secondary | ICD-10-CM

## 2019-06-03 ENCOUNTER — Encounter: Payer: Self-pay | Admitting: Family Medicine

## 2019-06-03 DIAGNOSIS — J4540 Moderate persistent asthma, uncomplicated: Secondary | ICD-10-CM

## 2019-06-04 ENCOUNTER — Other Ambulatory Visit: Payer: Self-pay | Admitting: Family

## 2019-06-04 DIAGNOSIS — J4540 Moderate persistent asthma, uncomplicated: Secondary | ICD-10-CM

## 2019-06-04 MED ORDER — ALBUTEROL SULFATE HFA 108 (90 BASE) MCG/ACT IN AERS: 2.0000 | INHALATION_SPRAY | Freq: Four times a day (QID) | RESPIRATORY_TRACT | 1 refills | Status: DC | PRN

## 2019-06-04 MED ORDER — ESOMEPRAZOLE MAGNESIUM 40 MG PO CPDR: 40.0000 mg | DELAYED_RELEASE_CAPSULE | Freq: Every day | ORAL | 1 refills | Status: DC

## 2019-06-25 ENCOUNTER — Other Ambulatory Visit: Payer: Self-pay | Admitting: Family Medicine

## 2019-07-04 ENCOUNTER — Other Ambulatory Visit: Payer: Self-pay | Admitting: Family Medicine

## 2019-07-04 DIAGNOSIS — F418 Other specified anxiety disorders: Secondary | ICD-10-CM

## 2019-07-29 ENCOUNTER — Other Ambulatory Visit: Payer: Self-pay | Admitting: Family Medicine

## 2019-07-29 DIAGNOSIS — F418 Other specified anxiety disorders: Secondary | ICD-10-CM

## 2019-07-30 ENCOUNTER — Other Ambulatory Visit: Payer: Self-pay | Admitting: Family Medicine

## 2019-07-30 DIAGNOSIS — F418 Other specified anxiety disorders: Secondary | ICD-10-CM

## 2019-08-26 ENCOUNTER — Other Ambulatory Visit: Payer: Self-pay | Admitting: Family Medicine

## 2019-08-26 DIAGNOSIS — F418 Other specified anxiety disorders: Secondary | ICD-10-CM

## 2019-08-27 NOTE — Telephone Encounter (Signed)
Dettinger. NTBS 30 days given 07/04/19 

## 2019-09-29 ENCOUNTER — Other Ambulatory Visit: Payer: Self-pay | Admitting: Family Medicine

## 2019-10-02 ENCOUNTER — Other Ambulatory Visit: Payer: Self-pay | Admitting: Family Medicine

## 2019-10-02 NOTE — Telephone Encounter (Signed)
Dettinger. NTBS LOV 09/05/18.  

## 2019-10-02 NOTE — Telephone Encounter (Signed)
Called patient, no answer, left message to return call 

## 2019-10-25 ENCOUNTER — Other Ambulatory Visit: Payer: Self-pay | Admitting: Family Medicine

## 2019-11-28 ENCOUNTER — Other Ambulatory Visit: Payer: Self-pay | Admitting: Family Medicine

## 2019-11-28 DIAGNOSIS — F418 Other specified anxiety disorders: Secondary | ICD-10-CM

## 2019-12-03 ENCOUNTER — Other Ambulatory Visit: Payer: Self-pay | Admitting: Family Medicine

## 2019-12-03 DIAGNOSIS — F418 Other specified anxiety disorders: Secondary | ICD-10-CM

## 2020-01-30 ENCOUNTER — Encounter: Payer: Self-pay | Admitting: Family Medicine

## 2020-03-21 ENCOUNTER — Other Ambulatory Visit: Payer: Self-pay | Admitting: Family Medicine

## 2020-03-21 DIAGNOSIS — F418 Other specified anxiety disorders: Secondary | ICD-10-CM

## 2020-04-02 ENCOUNTER — Encounter: Payer: Self-pay | Admitting: Family Medicine

## 2020-04-03 ENCOUNTER — Ambulatory Visit (INDEPENDENT_AMBULATORY_CARE_PROVIDER_SITE_OTHER): Payer: Managed Care, Other (non HMO) | Admitting: Family Medicine

## 2020-04-03 DIAGNOSIS — F418 Other specified anxiety disorders: Secondary | ICD-10-CM | POA: Diagnosis not present

## 2020-04-03 DIAGNOSIS — J069 Acute upper respiratory infection, unspecified: Secondary | ICD-10-CM

## 2020-04-03 MED ORDER — BENZONATATE 100 MG PO CAPS: 100.0000 mg | ORAL_CAPSULE | Freq: Three times a day (TID) | ORAL | 0 refills | Status: DC | PRN

## 2020-04-03 MED ORDER — ESCITALOPRAM OXALATE 10 MG PO TABS: 10.0000 mg | ORAL_TABLET | Freq: Every day | ORAL | 0 refills | Status: DC

## 2020-04-03 MED ORDER — PREDNISONE 20 MG PO TABS: 40.0000 mg | ORAL_TABLET | Freq: Every day | ORAL | 0 refills | Status: AC

## 2020-04-03 NOTE — Progress Notes (Signed)
Telephone visit  Subjective: CC: URI PCP: Dettinger, Elige Radon, MD [id number] Baratta is a 24 y.o. female calls for telephone consult today. Patient provides verbal consent for consult held via phone.  Due to COVID-19 pandemic this visit was conducted virtually. This visit type was conducted due to national recommendations for restrictions regarding the COVID-19 Pandemic (e.g. social distancing, sheltering in place) in an effort to limit this patient's exposure and mitigate transmission in our community. All issues noted in this document were discussed and addressed.  A physical exam was not performed with this format.   Location of patient: home Location of provider: [facility] Others present for call: none  1. URI Patient reports chest congestion.  She has been using inhaler, which helps her cough stuff up.  She reports onset of symptoms Monday, symptoms are slightly worse.  She reports fevers but has not checked her temp.  No myalgia, diarrhea, nausea, vomiting.  No SOB, wheezing.  She has not used any mucinex.  She used pseudofed.  No known sick contacts. Not vaccinated against COVID.  She had COVID in the last 2 months.   ROS: Per HPI  Allergies  Allergen Reactions  . Penicillins Anaphylaxis    Throat swells up.  Marland Kitchen Shellfish-Derived Products Anaphylaxis  . Bactrim [Sulfamethoxazole-Trimethoprim] Hives  . Hydrocodone Hives  . Yellow Dyes (Non-Tartrazine) Rash   Past Medical History:  Diagnosis Date  . Allergy   . Anxiety   . Asthma   . Cardiac arrest (HCC)   . Contraceptive management 12/17/2012  . Depression     Current Outpatient Medications:  .  albuterol (VENTOLIN HFA) 108 (90 Base) MCG/ACT inhaler, Inhale 2 puffs into the lungs every 6 (six) hours as needed for wheezing or shortness of breath. (Needs to be seen before next refill), Disp: 8 g, Rfl: 1 .  ARIPiprazole (ABILIFY) 10 MG tablet, Take 1 tablet (10 mg total) by mouth daily., Disp: 90 tablet, Rfl: 1 .  busPIRone (BUSPAR)  15 MG tablet, TAKE 1 TABLET (15 MG TOTAL) BY MOUTH 3 (THREE) TIMES DAILY., Disp: 270 tablet, Rfl: 1 .  cetirizine (ZYRTEC) 10 MG tablet, Take 10 mg by mouth daily., Disp: , Rfl:  .  dicyclomine (BENTYL) 20 MG tablet, Take 1 tablet (20 mg total) by mouth 2 (two) times daily as needed (Abdominal cramping)., Disp: 20 tablet, Rfl: 0 .  EPINEPHrine 0.3 mg/0.3 mL IJ SOAJ injection, Inject 0.3 mLs (0.3 mg total) into the muscle as needed for anaphylaxis., Disp: 1 Device, Rfl: 1 .  escitalopram (LEXAPRO) 10 MG tablet, Take 1 tablet (10 mg total) by mouth daily. Needs to be seen for further refills., Disp: 30 tablet, Rfl: 0 .  esomeprazole (NEXIUM) 40 MG capsule, Take 1 capsule (40 mg total) by mouth daily., Disp: 90 capsule, Rfl: 1 .  etonogestrel-ethinyl estradiol (NUVARING) 0.12-0.015 MG/24HR vaginal ring, INSERT 1 RING VAGINALLY AS DIRECTED. REMOVE AFTER 3 WEEKS & WAIT 7 DAYS BEFORE INSERTING A NEW RING (NEEDS TO BE SEEN BEFORE NEXT REFILL), Disp: 1 each, Rfl: 0 .  famotidine (PEPCID) 20 MG tablet, TAKE 1 TABLET BY MOUTH TWICE A DAY, Disp: 180 tablet, Rfl: 1 .  fluticasone furoate-vilanterol (BREO ELLIPTA) 100-25 MCG/INH AEPB, Inhale 1 puff into the lungs daily., Disp: 30 each, Rfl: 5 .  hydrOXYzine (ATARAX/VISTARIL) 50 MG tablet, Take 1 tablet (50 mg total) by mouth daily. (Please make your 3 mos appt), Disp: 30 tablet, Rfl: 0 .  montelukast (SINGULAIR) 10 MG tablet, TAKE 1 TABLET BY MOUTH  EVERYDAY AT BEDTIME, Disp: 90 tablet, Rfl: 3 .  ondansetron (ZOFRAN ODT) 4 MG disintegrating tablet, Take 1 tablet (4 mg total) by mouth every 8 (eight) hours as needed for nausea or vomiting., Disp: 20 tablet, Rfl: 0  Gen: sounds raspy on phone  Assessment/ Plan: 24 y.o. female   URI with cough and congestion - Plan: predniSONE (DELTASONE) 20 MG tablet, benzonatate (TESSALON PERLES) 100 MG capsule  Depression with anxiety - Plan: escitalopram (LEXAPRO) 10 MG tablet  Prednisone, Tessalon Perles sent given asthma.   Does not sound like she is having any respiratory distress.  I have asked that she use Mucinex and plenty of water to break up any chest congestion.  It most certainly sounds viral at this point.  We discussed reasons for reevaluation and she voiced good understanding.  Her Lexapro also has been renewed until she can see her PCP next month  Start time: 12:55pm End time: 1:01pm  Total time spent on patient care (including telephone call/ virtual visit): 6 minutes  Abigaile Rossie Hulen Skains, DO [facility] [phone]

## 2020-04-08 ENCOUNTER — Encounter: Payer: Self-pay | Admitting: Family Medicine

## 2020-04-08 ENCOUNTER — Other Ambulatory Visit: Payer: Self-pay | Admitting: Family Medicine

## 2020-04-08 MED ORDER — AZITHROMYCIN 250 MG PO TABS: ORAL_TABLET | ORAL | 0 refills | Status: DC

## 2020-04-08 NOTE — Telephone Encounter (Signed)
Televisit with you on 11/19.

## 2020-04-17 ENCOUNTER — Encounter: Payer: Self-pay | Admitting: Family Medicine

## 2020-04-17 ENCOUNTER — Ambulatory Visit (INDEPENDENT_AMBULATORY_CARE_PROVIDER_SITE_OTHER): Payer: Managed Care, Other (non HMO) | Admitting: Family Medicine

## 2020-04-17 ENCOUNTER — Other Ambulatory Visit: Payer: Self-pay

## 2020-04-17 VITALS — BP 132/91 | HR 87 | Temp 97.0°F | Ht 61.0 in | Wt 228.0 lb

## 2020-04-17 DIAGNOSIS — F3181 Bipolar II disorder: Secondary | ICD-10-CM

## 2020-04-17 DIAGNOSIS — Z23 Encounter for immunization: Secondary | ICD-10-CM | POA: Diagnosis not present

## 2020-04-17 DIAGNOSIS — F418 Other specified anxiety disorders: Secondary | ICD-10-CM

## 2020-04-17 MED ORDER — ARIPIPRAZOLE 5 MG PO TABS: 5.0000 mg | ORAL_TABLET | Freq: Every day | ORAL | 1 refills | Status: DC

## 2020-04-17 MED ORDER — ESCITALOPRAM OXALATE 10 MG PO TABS: 10.0000 mg | ORAL_TABLET | Freq: Every day | ORAL | 3 refills | Status: DC

## 2020-04-17 NOTE — Progress Notes (Signed)
BP (!) 132/91   Pulse 87   Temp (!) 97 F (36.1 C)   Ht 5\' 1"  (1.549 m)   Wt 228 lb (103.4 kg)   LMP 03/20/2020   SpO2 94%   BMI 43.08 kg/m    Subjective:   Patient ID: [id number], female    DOB: 04-Jul-1995, 24 y.o.   MRN: [id number]  HPI: Nichole Guerra is a 24 y.o. female presenting on 04/17/2020 for Medical Management of Chronic Issues, Anxiety, and Depression   HPI Anxiety and depression and bipolar recheck Patient is coming in today for anxiety and depression recheck.  She says she is doing well on Lexapro but she does have some days where for couple days at a time.  She does not have full-blown manic episodes but does have more energy and does not sleep all the time.  She did stop her Abilify felt like one of them made her feel groggy and sedated.  She is willing to try something again for the bipolar.  She denies any suicidal ideations or thoughts of hurting self.  Relevant past medical, surgical, family and social history reviewed and updated as indicated. Interim medical history since our last visit reviewed. Allergies and medications reviewed and updated.  Review of Systems  Constitutional: Negative for chills and fever.  Eyes: Negative for visual disturbance.  Respiratory: Negative for chest tightness and shortness of breath.   Cardiovascular: Negative for chest pain and leg swelling.  Musculoskeletal: Negative for back pain and gait problem.  Skin: Negative for rash.  Neurological: Negative for light-headedness and headaches.  Psychiatric/Behavioral: Positive for dysphoric mood. Negative for agitation, behavioral problems, self-injury, sleep disturbance and suicidal ideas. The patient is nervous/anxious.   All other systems reviewed and are negative.   Per HPI unless specifically indicated above      Objective:   BP (!) 132/91   Pulse 87   Temp (!) 97 F (36.1 C)   Ht 5\' 1"  (1.549 m)   Wt 228 lb (103.4 kg)   LMP 03/20/2020   SpO2 94%   BMI 43.08  kg/m   Wt Readings from Last 3 Encounters:  04/17/20 228 lb (103.4 kg)  11/24/18 230 lb (104.3 kg)  08/06/18 211 lb (95.7 kg)    Physical Exam Vitals and nursing note reviewed.  Constitutional:      General: She is not in acute distress.    Appearance: She is well-developed. She is not diaphoretic.  Eyes:     Conjunctiva/sclera: Conjunctivae normal.  Cardiovascular:     Rate and Rhythm: Normal rate and regular rhythm.     Heart sounds: Normal heart sounds. No murmur heard.   Pulmonary:     Effort: Pulmonary effort is normal. No respiratory distress.     Breath sounds: Normal breath sounds. No wheezing.  Skin:    General: Skin is warm and dry.     Findings: No rash.  Neurological:     Mental Status: She is alert and oriented to person, place, and time.     Coordination: Coordination normal.  Psychiatric:        Mood and Affect: Mood is anxious and depressed.        Behavior: Behavior normal.        Thought Content: Thought content does not include suicidal ideation. Thought content does not include suicidal plan.       Assessment & Plan:   Problem List Items Addressed This Visit      Other  Depression with anxiety   Relevant Medications   escitalopram (LEXAPRO) 10 MG tablet   ARIPiprazole (ABILIFY) 5 MG tablet   Bipolar 2 disorder (HCC) - Primary   Relevant Medications   ARIPiprazole (ABILIFY) 5 MG tablet      Not continue Lexapro, will start Abilify back up at 5 mg instead of 10 and see how it does for her. Follow up plan: Return if symptoms worsen or fail to improve, for 1 to 2 months depression and anxiety recheck..  Counseling provided for all of the vaccine components No orders of the defined types were placed in this encounter.   Arville Care, MD [facility] 04/17/2020, 9:54 AM

## 2020-04-26 ENCOUNTER — Other Ambulatory Visit: Payer: Self-pay | Admitting: Family

## 2020-04-26 DIAGNOSIS — J4540 Moderate persistent asthma, uncomplicated: Secondary | ICD-10-CM

## 2020-05-09 ENCOUNTER — Other Ambulatory Visit: Payer: Self-pay | Admitting: Family Medicine

## 2020-05-09 DIAGNOSIS — F418 Other specified anxiety disorders: Secondary | ICD-10-CM

## 2020-05-09 DIAGNOSIS — F3181 Bipolar II disorder: Secondary | ICD-10-CM

## 2020-05-21 ENCOUNTER — Ambulatory Visit: Payer: Managed Care, Other (non HMO) | Admitting: Family Medicine

## 2020-05-29 ENCOUNTER — Other Ambulatory Visit: Payer: Self-pay | Admitting: Family Medicine

## 2020-05-29 DIAGNOSIS — J069 Acute upper respiratory infection, unspecified: Secondary | ICD-10-CM

## 2020-05-31 ENCOUNTER — Encounter: Payer: Self-pay | Admitting: Family Medicine

## 2020-06-01 ENCOUNTER — Ambulatory Visit: Payer: Managed Care, Other (non HMO) | Admitting: Family Medicine

## 2020-06-01 ENCOUNTER — Other Ambulatory Visit: Payer: Self-pay

## 2020-06-01 ENCOUNTER — Other Ambulatory Visit: Payer: Self-pay | Admitting: Family Medicine

## 2020-06-04 ENCOUNTER — Encounter: Payer: Self-pay | Admitting: Family Medicine

## 2020-06-10 ENCOUNTER — Encounter: Payer: Self-pay | Admitting: Family Medicine

## 2020-06-12 ENCOUNTER — Encounter: Payer: Self-pay | Admitting: Family

## 2020-06-12 ENCOUNTER — Other Ambulatory Visit: Payer: Self-pay | Admitting: Family

## 2020-06-12 ENCOUNTER — Telehealth (INDEPENDENT_AMBULATORY_CARE_PROVIDER_SITE_OTHER): Payer: Managed Care, Other (non HMO) | Admitting: Family

## 2020-06-12 DIAGNOSIS — R112 Nausea with vomiting, unspecified: Secondary | ICD-10-CM | POA: Diagnosis not present

## 2020-06-12 DIAGNOSIS — R197 Diarrhea, unspecified: Secondary | ICD-10-CM

## 2020-06-12 MED ORDER — ONDANSETRON HCL 4 MG PO TABS: 4.0000 mg | ORAL_TABLET | Freq: Three times a day (TID) | ORAL | 0 refills | Status: DC | PRN

## 2020-06-12 NOTE — Progress Notes (Signed)
Virtual Visit via telephone Note Due to COVID-19 pandemic this visit was conducted virtually. This visit type was conducted due to national recommendations for restrictions regarding the COVID-19 Pandemic (e.g. social distancing, sheltering in place) in an effort to limit this patient's exposure and mitigate transmission in our community. All issues noted in this document were discussed and addressed.  A physical exam was not performed with this format.  I connected with Nichole Guerra on 06/12/20 at 12:53 pm  by telephone and verified that I am speaking with the correct person using two identifiers. Nichole Guerra is currently located at home and no one is currently with her during visit. The provider, Jannifer Rodney, FNP is located in their office at time of visit.  I discussed the limitations, risks, security and privacy concerns of performing an evaluation and management service by telephone and the availability of in person appointments. I also discussed with the patient that there may be a patient responsible charge related to this service. The patient expressed understanding and agreed to proceed.   History and Present Illness:  Pt calls the office today with nausea, vomiting, and diarrhea for the last two days. She reports she took a COVID test at home and it was negative. She reports she has multiple co-workers test positive for COVID.  Emesis  This is a new problem. The current episode started in the past 7 days. The problem occurs 5 to 10 times per day. The problem has been gradually improving. There has been no fever. Associated symptoms include diarrhea. Pertinent negatives include no arthralgias, chills, coughing, fever, myalgias or sweats. She has tried acetaminophen and bed rest for the symptoms. The treatment provided mild relief.  Diarrhea  This is a chronic problem. The current episode started more than 1 year ago. The problem occurs 2 to 4 times per day. Associated symptoms include  vomiting. Pertinent negatives include no arthralgias, chills, coughing, fever, myalgias or sweats.      Review of Systems  Constitutional: Negative for chills and fever.  Respiratory: Negative for cough.   Gastrointestinal: Positive for diarrhea and vomiting.  Musculoskeletal: Negative for arthralgias and myalgias.  All other systems reviewed and are negative.    Observations/Objective: No SOB or distress noted   Assessment and Plan: 1. Nausea and vomiting, intractability of vomiting not specified, unspecified vomiting type - Novel Coronavirus, NAA ([facility]) - ondansetron (ZOFRAN) 4 MG tablet; Take 1 tablet (4 mg total) by mouth every 8 (eight) hours as needed for nausea or vomiting.  Dispense: 20 tablet; Refill: 0  2. Diarrhea, unspecified type - Novel Coronavirus, NAA ([facility]) - ondansetron (ZOFRAN) 4 MG tablet; Take 1 tablet (4 mg total) by mouth every 8 (eight) hours as needed for nausea or vomiting.  Dispense: 20 tablet; Refill: 0   Will do COVID test to rule out Work note given Rest, force fluids, tylenol as needed, Quarantine until results return,  report any worsening symptoms such as increased shortness of breath, swelling, or continued high fevers.   I discussed the assessment and treatment plan with the patient. The patient was provided an opportunity to ask questions and all were answered. The patient agreed with the plan and demonstrated an understanding of the instructions.   The patient was advised to call back or seek an in-person evaluation if the symptoms worsen or if the condition fails to improve as anticipated.  The above assessment and management plan was discussed with the patient. The patient verbalized understanding of and has agreed  to the management plan. Patient is aware to call the clinic if symptoms persist or worsen. Patient is aware when to return to the clinic for a follow-up visit. Patient educated on when it is appropriate to go to the emergency  department.   Time call ended: 1:04 pm    I provided 11 minutes of non-face-to-face time during this encounter.    Jannifer Rodney, FNP

## 2020-06-14 LAB — NOVEL CORONAVIRUS, NAA: SARS-CoV-2, NAA: NOT DETECTED

## 2020-06-14 LAB — SARS-COV-2, NAA 2 DAY TAT

## 2020-06-15 ENCOUNTER — Encounter: Payer: Self-pay | Admitting: Family Medicine

## 2020-06-15 ENCOUNTER — Other Ambulatory Visit: Payer: Self-pay | Admitting: Family

## 2020-09-09 ENCOUNTER — Encounter: Payer: Self-pay | Admitting: Family Medicine

## 2020-09-09 ENCOUNTER — Ambulatory Visit (INDEPENDENT_AMBULATORY_CARE_PROVIDER_SITE_OTHER): Payer: Managed Care, Other (non HMO) | Admitting: Family Medicine

## 2020-09-09 DIAGNOSIS — B9689 Other specified bacterial agents as the cause of diseases classified elsewhere: Secondary | ICD-10-CM

## 2020-09-09 DIAGNOSIS — J988 Other specified respiratory disorders: Secondary | ICD-10-CM | POA: Diagnosis not present

## 2020-09-09 DIAGNOSIS — R059 Cough, unspecified: Secondary | ICD-10-CM | POA: Diagnosis not present

## 2020-09-09 MED ORDER — AZITHROMYCIN 250 MG PO TABS: ORAL_TABLET | ORAL | 0 refills | Status: DC

## 2020-09-09 MED ORDER — METHYLPREDNISOLONE 4 MG PO TBPK: ORAL_TABLET | ORAL | 0 refills | Status: DC

## 2020-09-09 NOTE — Addendum Note (Signed)
Addended by: Cassell Clement on: 09/09/2020 02:51 PM   Modules accepted: Orders

## 2020-09-09 NOTE — Progress Notes (Signed)
Virtual Visit via Telephone Note  I connected with Nichole Guerra on 09/09/20 at 12:59 PM by telephone and verified that I am speaking with the correct person using two identifiers. Nichole Guerra is currently located at [employer] and nobody is currently with her during this visit. The provider, Gwenlyn Fudge, FNP is located in their home at time of visit.  I discussed the limitations, risks, security and privacy concerns of performing an evaluation and management service by telephone and the availability of in person appointments. I also discussed with the patient that there may be a patient responsible charge related to this service. The patient expressed understanding and agreed to proceed.  Subjective: PCP: Dettinger, Elige Radon, MD  Chief Complaint  Patient presents with  . URI   Patient complains of cough and hoarseness. Additional symptoms include chest congestion, runny nose, sneezing, sore throat, postnasal drainage, shortness of breath and wheezing. Onset of symptoms was 1 week ago, gradually worsening since that time. She is drinking plenty of fluids. Evaluation to date: none. Treatment to date: antihistamines, decongestants, nasal steroids and inhalers for asthma. She has a history of asthma. She does not smoke. She has not been vaccinated against COVID-19.    ROS: Per HPI  Current Outpatient Medications:  .  albuterol (VENTOLIN HFA) 108 (90 Base) MCG/ACT inhaler, INHALE 2 PUFFS INTO THE LUNGS EVERY 6 (SIX) HOURS AS NEEDED FOR WHEEZING OR SHORTNESS OF BREATH., Disp: 6.7 each, Rfl: 1 .  ARIPiprazole (ABILIFY) 5 MG tablet, TAKE 1 TABLET BY MOUTH EVERY DAY, Disp: 90 tablet, Rfl: 0 .  cetirizine (ZYRTEC) 10 MG tablet, Take 10 mg by mouth daily., Disp: , Rfl:  .  EPINEPHrine 0.3 mg/0.3 mL IJ SOAJ injection, Inject 0.3 mLs (0.3 mg total) into the muscle as needed for anaphylaxis., Disp: 1 Device, Rfl: 1 .  escitalopram (LEXAPRO) 10 MG tablet, Take 1 tablet (10 mg total) by mouth  daily. Needs to be seen for further refills., Disp: 90 tablet, Rfl: 3 .  esomeprazole (NEXIUM) 40 MG capsule, Take 1 capsule (40 mg total) by mouth daily., Disp: 90 capsule, Rfl: 1 .  etonogestrel-ethinyl estradiol (NUVARING) 0.12-0.015 MG/24HR vaginal ring, INSERT 1 RING VAGINALLY AS DIRECTED. REMOVE AFTER 3 WEEKS & WAIT 7 DAYS BEFORE INSERTING A NEW RING (NEEDS TO BE SEEN BEFORE NEXT REFILL), Disp: 1 each, Rfl: 0 .  fluticasone furoate-vilanterol (BREO ELLIPTA) 100-25 MCG/INH AEPB, Inhale 1 puff into the lungs daily., Disp: 30 each, Rfl: 5 .  montelukast (SINGULAIR) 10 MG tablet, TAKE 1 TABLET BY MOUTH EVERYDAY AT BEDTIME, Disp: 90 tablet, Rfl: 0 .  ondansetron (ZOFRAN) 4 MG tablet, Take 1 tablet (4 mg total) by mouth every 8 (eight) hours as needed for nausea or vomiting., Disp: 20 tablet, Rfl: 0  Allergies  Allergen Reactions  . Penicillins Anaphylaxis    Throat swells up.  Marland Kitchen Shellfish-Derived Products Anaphylaxis  . Bactrim [Sulfamethoxazole-Trimethoprim] Hives  . Hydrocodone Hives  . Yellow Dyes (Non-Tartrazine) Rash   Past Medical History:  Diagnosis Date  . Allergy   . Anxiety   . Asthma   . Cardiac arrest (HCC)   . Contraceptive management 12/17/2012  . Depression     Observations/Objective: A&O  No respiratory distress or wheezing audible over the phone Mood, judgement, and thought processes all WNL  Assessment and Plan: 1. Bacterial respiratory infection - methylPREDNISolone (MEDROL DOSEPAK) 4 MG TBPK tablet; Use as directed.  Dispense: 21 each; Refill: 0 - azithromycin (ZITHROMAX Z-PAK) 250 MG tablet;  Take 2 tablets (500 mg) PO today, then 1 tablet (250 mg) PO daily x4 days.  Dispense: 6 tablet; Refill: 0  2. Cough - Novel Coronavirus, NAA ([facility]); Future   Follow Up Instructions:  I discussed the assessment and treatment plan with the patient. The patient was provided an opportunity to ask questions and all were answered. The patient agreed with the plan and  demonstrated an understanding of the instructions.   The patient was advised to call back or seek an in-person evaluation if the symptoms worsen or if the condition fails to improve as anticipated.  The above assessment and management plan was discussed with the patient. The patient verbalized understanding of and has agreed to the management plan. Patient is aware to call the clinic if symptoms persist or worsen. Patient is aware when to return to the clinic for a follow-up visit. Patient educated on when it is appropriate to go to the emergency department.   Time call ended: 1:10 PM  I provided 11 minutes of non-face-to-face time during this encounter.  Deliah Boston, MSN, APRN, FNP-C [facility] 09/09/20

## 2020-09-10 LAB — NOVEL CORONAVIRUS, NAA: SARS-CoV-2, NAA: NOT DETECTED

## 2020-09-10 LAB — SARS-COV-2, NAA 2 DAY TAT

## 2020-10-24 ENCOUNTER — Other Ambulatory Visit: Payer: Self-pay | Admitting: Family Medicine

## 2020-10-26 ENCOUNTER — Encounter: Payer: Self-pay | Admitting: Family Medicine

## 2020-10-26 ENCOUNTER — Encounter: Payer: Self-pay | Admitting: Nurse Practitioner

## 2020-10-26 ENCOUNTER — Ambulatory Visit: Payer: Managed Care, Other (non HMO) | Admitting: Nurse Practitioner

## 2020-10-26 DIAGNOSIS — J069 Acute upper respiratory infection, unspecified: Secondary | ICD-10-CM | POA: Insufficient documentation

## 2020-10-26 MED ORDER — PREDNISONE 10 MG (21) PO TBPK: ORAL_TABLET | ORAL | 0 refills | Status: DC

## 2020-10-26 NOTE — Progress Notes (Signed)
   Virtual Visit  Note Due to COVID-19 pandemic this visit was conducted virtually. This visit type was conducted due to national recommendations for restrictions regarding the COVID-19 Pandemic (e.g. social distancing, sheltering in place) in an effort to limit this patient's exposure and mitigate transmission in our community. All issues noted in this document were discussed and addressed.  A physical exam was not performed with this format.  I connected with Nichole Guerra on 10/26/20 at 1 pm  by telephone and verified that I am speaking with the correct person using two identifiers. Nichole Guerra is currently located at home during visit. The provider, Daryll Drown, NP is located in their office at time of visit.  I discussed the limitations, risks, security and privacy concerns of performing an evaluation and management service by telephone and the availability of in person appointments. I also discussed with the patient that there may be a patient responsible charge related to this service. The patient expressed understanding and agreed to proceed.   History and Present Illness:  URI  This is a recurrent problem. The problem has been unchanged. There has been no fever. Associated symptoms include congestion and sneezing. Pertinent negatives include no coughing, headaches or rash. She has tried antihistamine for the symptoms. The treatment provided mild relief.     Review of Systems  Constitutional: Negative.   HENT:  Positive for congestion and sneezing.   Respiratory:  Negative for cough.   Skin:  Negative for rash.  Neurological:  Negative for headaches.  All other systems reviewed and are negative.   Observations/Objective:  Televisit patient did not sound to be in distress.  Assessment and Plan:  URI with cough and congestion Patient is reporting recurrent upper respiratory symptoms with cough and congestion.  Patient reports allergies worsening symptoms.  Patient was  recently treated with antibiotics.  Advised patient to continue albuterol, coolmist humidifier take medication as prescribed,  prednisone taper, coolmist humidifier    Follow Up Instructions:  Follow-up with worsening or unresolved symptoms.   I discussed the assessment and treatment plan with the patient. The patient was provided an opportunity to ask questions and all were answered. The patient agreed with the plan and demonstrated an understanding of the instructions.   The patient was advised to call back or seek an in-person evaluation if the symptoms worsen or if the condition fails to improve as anticipated.  The above assessment and management plan was discussed with the patient. The patient verbalized understanding of and has agreed to the management plan. Patient is aware to call the clinic if symptoms persist or worsen. Patient is aware when to return to the clinic for a follow-up visit. Patient educated on when it is appropriate to go to the emergency department.   Time call ended: 1:10 PM  I provided 10 minutes of  non face-to-face time during this encounter.    Daryll Drown, NP

## 2020-10-26 NOTE — Assessment & Plan Note (Signed)
Patient is reporting recurrent upper respiratory symptoms with cough and congestion.  Patient reports allergies worsening symptoms.  Patient was recently treated with antibiotics.  Advised patient to continue albuterol, coolmist humidifier take medication as prescribed,  prednisone taper, coolmist humidifier

## 2020-10-27 MED ORDER — PREDNISONE 10 MG (21) PO TBPK: ORAL_TABLET | ORAL | 0 refills | Status: DC

## 2020-10-27 NOTE — Addendum Note (Signed)
Addended by: Magdalene River on: 10/27/2020 08:33 AM   Modules accepted: Orders

## 2020-11-03 ENCOUNTER — Encounter: Payer: Self-pay | Admitting: Family Medicine

## 2020-11-30 ENCOUNTER — Encounter (HOSPITAL_COMMUNITY): Payer: Self-pay | Admitting: *Deleted

## 2020-11-30 ENCOUNTER — Other Ambulatory Visit: Payer: Self-pay

## 2020-11-30 ENCOUNTER — Emergency Department (HOSPITAL_COMMUNITY)
Admission: EM | Admit: 2020-11-30 | Discharge: 2020-11-30 | Disposition: A | Discharge status: Home / Self Care | Payer: Managed Care, Other (non HMO) | Attending: Emergency Medicine | Admitting: Emergency Medicine

## 2020-11-30 DIAGNOSIS — Z3202 Encounter for pregnancy test, result negative: Secondary | ICD-10-CM | POA: Insufficient documentation

## 2020-11-30 DIAGNOSIS — R112 Nausea with vomiting, unspecified: Secondary | ICD-10-CM | POA: Insufficient documentation

## 2020-11-30 DIAGNOSIS — J4540 Moderate persistent asthma, uncomplicated: Secondary | ICD-10-CM | POA: Insufficient documentation

## 2020-11-30 DIAGNOSIS — Z79899 Other long term (current) drug therapy: Secondary | ICD-10-CM | POA: Diagnosis not present

## 2020-11-30 LAB — URINALYSIS, ROUTINE W REFLEX MICROSCOPIC
Bacteria, UA: NONE SEEN
Bilirubin Urine: NEGATIVE
Glucose, UA: NEGATIVE mg/dL
Hgb urine dipstick: NEGATIVE
Ketones, ur: NEGATIVE mg/dL
Nitrite: NEGATIVE
Protein, ur: NEGATIVE mg/dL
Specific Gravity, Urine: 1.015 (ref 1.005–1.030)
pH: 6 (ref 5.0–8.0)

## 2020-11-30 LAB — PREGNANCY, URINE: Preg Test, Ur: NEGATIVE

## 2020-11-30 NOTE — Discharge Instructions (Addendum)
Your pregnancy test today is negative. Recommend recheck with your gynecologist as scheduled.  You can repeat your home test in 1 week.  Advised against alcohol use or tobacco products, eat a healthy diet.  If pregnancy test is positive, take a prenatal vitamin and follow-up with your gynecologist.

## 2020-11-30 NOTE — ED Triage Notes (Signed)
Pt with emesis every time she eats for past 2-3 weeks.  One week late on her monthly.  Has taken multiple home pregnancy tests and all have been negative. Pt states she is able to hold down PO fluids.  Has an appt with OB/Gyn on 8/8

## 2020-11-30 NOTE — ED Provider Notes (Signed)
[facility] EMERGENCY DEPARTMENT Provider Note   CSN: [id number] Arrival date & time: 11/30/20  1446     History Chief Complaint  Patient presents with   Emesis    Nichole Guerra is a 25 y.o. female.  25 year old female presents with complaint of nausea with occasional vomiting.  LMP 5 weeks ago, multiple home negative pregnancy tests, concern for possible pregnancy.  Patient is scheduled to see her gynecologist on August 8.  Patient is sexually active, not currently on birth control.  Denies abdominal pain, changes in bowel or bladder habits, vaginal discharge or any other complaints or concerns today.      Past Medical History:  Diagnosis Date   Allergy    Anxiety    Asthma    Cardiac arrest [facility])    Contraceptive management 12/17/2012   Depression     Patient Active Problem List   Diagnosis Date Noted   URI with cough and congestion 10/26/2020   Bipolar 2 disorder (HCC) 07/05/2018   Asthma, moderate persistent 02/02/2017   Environmental allergies 02/02/2017   Food allergy 02/02/2017   Depression with anxiety 02/02/2017   Obesity (BMI 35.0-39.9 without comorbidity) 02/02/2017   Contraceptive management 12/17/2012    History reviewed. No pertinent surgical history.   OB History   No obstetric history on file.     Family History  Problem Relation Age of Onset   Hypertension Mother    Other Mother        pre-cancerous cells on cervix   Diabetes Father    Miscarriages / Stillbirths Maternal Grandmother    Hypertension Maternal Grandmother    Heart disease Maternal Grandfather    Stroke Paternal Grandfather    Hyperlipidemia Paternal Grandmother    Asthma Paternal Grandmother    COPD Paternal Grandmother    Stroke Other        paternal great grandma   Cancer Maternal Uncle        prostate, bladder, bone   Cancer Paternal Uncle        lung    Social History   Tobacco Use   Smoking status: Never   Smokeless tobacco: Never  Vaping Use   Vaping Use:  Never used  Substance Use Topics   Alcohol use: No    Comment: social infrequent   Drug use: No    Home Medications Prior to Admission medications   Medication Sig Start Date End Date Taking? Authorizing Provider  albuterol (VENTOLIN HFA) 108 (90 Base) MCG/ACT inhaler INHALE 2 PUFFS INTO THE LUNGS EVERY 6 (SIX) HOURS AS NEEDED FOR WHEEZING OR SHORTNESS OF BREATH. 04/27/20   Dettinger, Elige Radon, MD  ARIPiprazole (ABILIFY) 5 MG tablet TAKE 1 TABLET BY MOUTH EVERY DAY 05/11/20   Dettinger, Elige Radon, MD  cetirizine (ZYRTEC) 10 MG tablet Take 10 mg by mouth daily.    [provider]  EPINEPHrine 0.3 mg/0.3 mL IJ SOAJ injection Inject 0.3 mLs (0.3 mg total) into the muscle as needed for anaphylaxis. 06/11/18   Dettinger, Elige Radon, MD  escitalopram (LEXAPRO) 10 MG tablet Take 1 tablet (10 mg total) by mouth daily. Needs to be seen for further refills. 04/17/20   Dettinger, Elige Radon, MD  esomeprazole (NEXIUM) 40 MG capsule Take 1 capsule (40 mg total) by mouth daily. 06/04/19   Junie Spencer, FNP  etonogestrel-ethinyl estradiol (NUVARING) 0.12-0.015 MG/24HR vaginal ring INSERT 1 RING VAGINALLY AS DIRECTED. REMOVE AFTER 3 WEEKS & WAIT 7 DAYS BEFORE INSERTING A NEW RING (NEEDS TO BE  SEEN BEFORE NEXT REFILL) 09/30/19   Dettinger, Elige Radon, MD  fluticasone furoate-vilanterol (BREO ELLIPTA) 100-25 MCG/INH AEPB Inhale 1 puff into the lungs daily. 06/11/18   Dettinger, Elige Radon, MD  montelukast (SINGULAIR) 10 MG tablet TAKE 1 TABLET BY MOUTH EVERYDAY AT BEDTIME 10/26/20   Dettinger, Elige Radon, MD  ondansetron (ZOFRAN) 4 MG tablet Take 1 tablet (4 mg total) by mouth every 8 (eight) hours as needed for nausea or vomiting. 06/12/20   Junie Spencer, FNP  predniSONE (STERAPRED UNI-PAK 21 TAB) 10 MG (21) TBPK tablet 6 tablets day 1, 5 tablet day 2, 4 tablet day 3, 3 tablet daily 4, 2 tablet day 5, 1 tablet day 6. 10/27/20   Daryll Drown, NP    Allergies    Penicillins, Shellfish-derived products,  Bactrim [sulfamethoxazole-trimethoprim], Hydrocodone, and Yellow dyes (non-tartrazine)  Review of Systems   Review of Systems  Constitutional:  Negative for fever.  Respiratory:  Negative for shortness of breath.   Cardiovascular:  Negative for chest pain.  Gastrointestinal:  Positive for nausea and vomiting. Negative for abdominal pain, constipation and diarrhea.  Genitourinary:  Negative for dysuria, frequency, vaginal bleeding and vaginal discharge.  Musculoskeletal:  Negative for arthralgias and myalgias.  Skin:  Negative for rash and wound.  Allergic/Immunologic: Negative for immunocompromised state.  Neurological:  Negative for weakness.  All other systems reviewed and are negative.  Physical Exam Updated Vital Signs BP 121/80 (BP Location: Left Arm)   Pulse 73   Temp 97.9 F (36.6 C) (Oral)   Resp 16   Ht 5\' 1"  (1.549 m)   Wt 106.6 kg   LMP 10/26/2020   SpO2 96%   BMI 44.40 kg/m   Physical Exam Vitals and nursing note reviewed.  Constitutional:      General: She is not in acute distress.    Appearance: She is well-developed. She is not diaphoretic.  HENT:     Head: Normocephalic and atraumatic.  Cardiovascular:     Rate and Rhythm: Normal rate and regular rhythm.     Heart sounds: Normal heart sounds.  Pulmonary:     Effort: Pulmonary effort is normal.     Breath sounds: Normal breath sounds.  Abdominal:     Palpations: Abdomen is soft.     Tenderness: There is no abdominal tenderness.  Skin:    General: Skin is warm and dry.     Findings: No erythema or rash.  Neurological:     Mental Status: She is alert and oriented to person, place, and time.  Psychiatric:        Behavior: Behavior normal.    ED Results / Procedures / Treatments   Labs (all labs ordered are listed, but only abnormal results are displayed) Labs Reviewed  URINALYSIS, ROUTINE W REFLEX MICROSCOPIC - Abnormal; Notable for the following components:      Result Value   APPearance HAZY  (*)    Leukocytes,Ua MODERATE (*)    All other components within normal limits  PREGNANCY, URINE    EKG None  Radiology No results found.  Procedures Procedures   Medications Ordered in ED Medications - No data to display  ED Course  I have reviewed the triage vital signs and the nursing notes.  Pertinent labs & imaging results that were available during my care of the patient were reviewed by me and considered in my medical decision making (see chart for details).  Clinical Course as of 11/30/20 1809  Mon Nov 30, 2020  3230 25 year old female with concern for possible pregnancy LMP 5 weeks ago, home test negative.  No complaints otherwise. Exam unremarkable, abdomen is soft and nontender. Urinalysis with moderate leukocytes however denies dysuria or frequency, does appear to be contaminated sample. Pending urine pregnancy test. [LM]  1809 Pregnancy test is negative, recommend retest at home and 1 week, if positive start prenatal and follow-up gynecologist.  Follow-up with gynecology as scheduled. [LM]    Clinical Course User Index [LM] Alden Hipp   MDM Rules/Calculators/A&P                           Final Clinical Impression(s) / ED Diagnoses Final diagnoses:  Non-intractable vomiting with nausea, unspecified vomiting type  Negative pregnancy test    Rx / DC Orders ED Discharge Orders     None        Jeannie Fend, PA-C 11/30/20 1809    Mancel Bale, MD 12/01/20 0007

## 2020-12-21 ENCOUNTER — Ambulatory Visit (INDEPENDENT_AMBULATORY_CARE_PROVIDER_SITE_OTHER): Payer: Managed Care, Other (non HMO) | Admitting: Adult Health

## 2020-12-21 ENCOUNTER — Other Ambulatory Visit (HOSPITAL_COMMUNITY)
Admission: RE | Admit: 2020-12-21 | Discharge: 2020-12-21 | Disposition: A | Discharge status: Home / Self Care | Payer: Managed Care, Other (non HMO) | Source: Ambulatory Visit | Attending: Adult Health | Admitting: Adult Health

## 2020-12-21 ENCOUNTER — Other Ambulatory Visit: Payer: Self-pay

## 2020-12-21 ENCOUNTER — Encounter: Payer: Self-pay | Admitting: Adult Health

## 2020-12-21 VITALS — BP 121/83 | HR 68 | Ht 62.0 in | Wt 244.0 lb

## 2020-12-21 DIAGNOSIS — Z113 Encounter for screening for infections with a predominantly sexual mode of transmission: Secondary | ICD-10-CM | POA: Diagnosis not present

## 2020-12-21 DIAGNOSIS — N926 Irregular menstruation, unspecified: Secondary | ICD-10-CM | POA: Insufficient documentation

## 2020-12-21 DIAGNOSIS — N760 Acute vaginitis: Secondary | ICD-10-CM | POA: Insufficient documentation

## 2020-12-21 DIAGNOSIS — Z30011 Encounter for initial prescription of contraceptive pills: Secondary | ICD-10-CM

## 2020-12-21 DIAGNOSIS — B9689 Other specified bacterial agents as the cause of diseases classified elsewhere: Secondary | ICD-10-CM | POA: Insufficient documentation

## 2020-12-21 DIAGNOSIS — Z3202 Encounter for pregnancy test, result negative: Secondary | ICD-10-CM | POA: Diagnosis not present

## 2020-12-21 LAB — POCT URINE PREGNANCY: Preg Test, Ur: NEGATIVE

## 2020-12-21 MED ORDER — NORETHIN ACE-ETH ESTRAD-FE 1-20 MG-MCG PO TABS: 1.0000 | ORAL_TABLET | Freq: Every day | ORAL | 4 refills | Status: DC

## 2020-12-21 NOTE — Progress Notes (Signed)
  Subjective:     Patient ID: Nichole Guerra, female   DOB: Jan 12, 1996, 25 y.o.   MRN: [id number]  HPI Alika is a 25 year old white female, single, G0P0, in complaining of spotting for last 2 months, no real period.And she wants to discuss birth control, wants the pill. PCP is Dr Louanne Skye. Lab Results  Component Value Date   DIAGPAP  08/06/2018    NEGATIVE FOR INTRAEPITHELIAL LESIONS OR MALIGNANCY.    Review of Systems Spotting for last 2 months, light, no pain Has 1 sex partner Denies any vaginal discharge. itching or burning.   Reviewed past medical,surgical, social and family history. Reviewed medications and allergies.  Objective:   Physical Exam BP 121/83 (BP Location: Left Arm, Patient Position: Sitting, Cuff Size: Large)   Pulse 68   Ht 5\' 2"  (1.575 m)   Wt 244 lb (110.7 kg)   LMP 10/19/2020 (Approximate)   BMI 44.63 kg/m  UPT is negative.Skin warm and dry.Pelvic: external genitalia is normal in appearance no lesions, vagina: white discharge without odor,urethra has no lesions or masses noted, cervix:smooth, uterus: normal size, shape and contour, non tender, no masses felt, adnexa: no masses or tenderness noted. Bladder is non tender and no masses felt.CV swab obtained.   Fall risk is low  Upstream - 12/21/20 0908       Pregnancy Intention Screening   Does the patient want to become pregnant in the next year? No    Does the patient's partner want to become pregnant in the next year? No    Would the patient like to discuss contraceptive options today? Yes      Contraception Wrap Up   Current Method No Method - Other Reason    End Method Oral Contraceptive    Contraception Counseling Provided Yes            Examination chaperoned by [id number] LPN  Assessment:     1. Pregnancy examination or test, negative result   2. Irregular periods   3. Encounter for initial prescription of contraceptive pills Will Rx Junel 1/20, can start today, and use condoms for 1  pack  Denies any MI,stroke,DVT or migraines with aura or breast cancer Meds ordered this encounter  Medications   norethindrone-ethinyl estradiol-FE (JUNEL FE 1/20) 1-20 MG-MCG tablet    Sig: Take 1 tablet by mouth daily.    Dispense:  84 tablet    Refill:  4    Order Specific Question:   Supervising Provider    Answer:   2/20, LUTHER H [[id number]]     4. Screening examination for STD (sexually transmitted disease) CV swab sent for GC/CHL,trich,BV and yeast     Plan:     Return in 8 months for pap and physical

## 2020-12-22 ENCOUNTER — Other Ambulatory Visit: Payer: Self-pay | Admitting: Adult Health

## 2020-12-22 LAB — CERVICOVAGINAL ANCILLARY ONLY
Bacterial Vaginitis (gardnerella): POSITIVE — AB
Candida Glabrata: NEGATIVE
Candida Vaginitis: NEGATIVE
Chlamydia: NEGATIVE
Comment: NEGATIVE
Comment: NEGATIVE
Comment: NEGATIVE
Comment: NEGATIVE
Comment: NEGATIVE
Comment: NORMAL
Neisseria Gonorrhea: NEGATIVE
Trichomonas: NEGATIVE

## 2020-12-22 MED ORDER — METRONIDAZOLE 500 MG PO TABS: 500.0000 mg | ORAL_TABLET | Freq: Two times a day (BID) | ORAL | 0 refills | Status: DC

## 2020-12-22 NOTE — Progress Notes (Signed)
Rx flagyl +BV on vaginal swab

## 2021-01-05 ENCOUNTER — Ambulatory Visit (INDEPENDENT_AMBULATORY_CARE_PROVIDER_SITE_OTHER): Payer: Managed Care, Other (non HMO) | Admitting: Nurse Practitioner

## 2021-01-05 ENCOUNTER — Encounter: Payer: Self-pay | Admitting: Nurse Practitioner

## 2021-01-05 ENCOUNTER — Other Ambulatory Visit: Payer: Self-pay

## 2021-01-05 VITALS — BP 110/72 | HR 63 | Temp 98.0°F | Ht 62.0 in | Wt 243.0 lb

## 2021-01-05 DIAGNOSIS — B370 Candidal stomatitis: Secondary | ICD-10-CM | POA: Diagnosis not present

## 2021-01-05 MED ORDER — FLUCONAZOLE 150 MG PO TABS: 150.0000 mg | ORAL_TABLET | Freq: Once | ORAL | 0 refills | Status: AC

## 2021-01-05 NOTE — Assessment & Plan Note (Signed)
Completed assessment, patient is hormone coated with white thrush.  Education provided to patient printed handouts given.  Patient is unable to take nystatin due to yellow dye allergy.  Diflucan 150 mg tablet by mouth once.  May repeat dose if symptoms not resolved.  Advised patient to follow-up with on unresolved or worsening symptoms.  Patient verbalized understanding. Rx sent to pharmacy.

## 2021-01-05 NOTE — Patient Instructions (Signed)
Oral Thrush, Adult Oral thrush is an infection in your mouth and throat and on your tongue. It causes white patches to form in your mouth and on your tongue. Many cases of thrush are mild. But, sometimes, thrush can be serious. People who have a weak body defense system (immune system) or other diseases can be affected more. What are the causes? This condition is caused by a type of fungus called yeast. The fungus is normally present in small amounts in the mouth and nose. If a person has a long-term illness or a weak body defense system, the fungus can grow and spread quickly. This causes thrush. What increases the risk? You are more likely to develop this condition if: You have a weak body defense system. You are an older adult. You have diabetes, cancer, or HIV. You have a dry mouth. You are pregnant or breastfeeding. You do not take good care of your teeth. This risk is greater for people who have false teeth (dentures). You use antibiotic or steroid medicines. What are the signs or symptoms? Symptoms of this condition include: A burning feeling in the mouth and throat. White patches that stick to the mouth and tongue. A bad taste in the mouth or trouble tasting foods. A feeling like you have cotton in your mouth. Pain when you eat and swallow. Not wanting to eat as much as usual. Cracking at the corners of the mouth. How is this treated? This condition is treated with medicines called antifungals. These medicines prevent a fungus from growing. The medicines are either put right on the area (topical) or swallowed (oral). Your doctor will also treat other problems that you may have, such as diabetes or HIV. Follow these instructions at home: Medicines Take or use over-the-counter and prescription medicines only as told by your doctor. Ask your doctor about an over-the-counter medicine called gentian violet. Helping with pain and soreness To lessen your pain: Drink cold liquids,  like water and iced tea. Eat frozen ice pops or frozen juices. Eat foods that are easy to swallow, like gelatin and ice cream. Drink from a straw if you have too much pain in your mouth.  General instructions Eat plain yogurt that has live cultures in it. Read the label to make sure that there are live cultures in your yogurt. If you wear false teeth: Take them out before you go to bed. Brush them well. Soak them in a cleaner. Rinse your mouth with warm salt-water many times a day. To make the salt-water mixture, dissolve -1 teaspoon (3-6 g) of salt in 1 cup (237 mL) of warm water. Contact a doctor if: Your problems do not get better within 7 days of treatment. Your infection is spreading. This may show as white areas on the skin outside of your mouth. You are nursing your baby and you have redness and pain in the nipples. Summary Oral thrush is an infection in your mouth and throat. It is caused by a fungus. You are more likely to get this condition if you have a weak body defense system. Diseases like diabetes, cancer, or HIV also add to your risk. This condition is treated with medicines called antifungals. Contact a doctor if you do not get better within 7 days of starting treatment. This information is not intended to replace advice given to you by your health care provider. Make sure you discuss any questions you have with your health care provider. Document Revised: 03/08/2019 Document Reviewed: 03/08/2019 Elsevier Patient Education    2022 Elsevier Inc.  

## 2021-01-05 NOTE — Progress Notes (Signed)
Acute Office Visit  Subjective:    Patient ID: Nichole Guerra, female    DOB: 18-Nov-1995, 25 y.o.   MRN: [id number]  Chief Complaint  Patient presents with   tongue raw    HPI Patient is in today for patient is a 25 year old female who presents to clinic with oral thrush.  Symptoms present in the last few days after completing Flagyl.  No pain, fever, nausea or vomiting associated with current symptoms.  Past Medical History:  Diagnosis Date   Allergy    Anxiety    Asthma    Cardiac arrest [facility])    Contraceptive management 12/17/2012   Depression     History reviewed. No pertinent surgical history.  Family History  Problem Relation Age of Onset   Hypertension Mother    Other Mother        pre-cancerous cells on cervix   Diabetes Father    Miscarriages / Stillbirths Maternal Grandmother    Hypertension Maternal Grandmother    Heart disease Maternal Grandfather    Stroke Paternal Grandfather    Hyperlipidemia Paternal Grandmother    Asthma Paternal Grandmother    COPD Paternal Grandmother    Stroke Other        paternal great grandma   Cancer Maternal Uncle        prostate, bladder, bone   Cancer Paternal Uncle        lung    Social History   Socioeconomic History   Marital status: Single    Spouse name: Not on file   Number of children: Not on file   Years of education: 82- GED   Highest education level: Not on file  Occupational History   Occupation: dietary     Employer: [employer]    Comment: [employer]   Tobacco Use   Smoking status: Never   Smokeless tobacco: Never  Vaping Use   Vaping Use: Never used  Substance and Sexual Activity   Alcohol use: No    Comment: social infrequent   Drug use: No   Sexual activity: Yes    Birth control/protection: Pill  Other Topics Concern   Not on file  Social History Narrative   Lives with parents   Younger brother Casimiro Needle in the home   Artistic, likes to craft and draw   Likes outdoors    Social Determinants of Corporate investment banker Strain: Not on file  Food Insecurity: Not on file  Transportation Needs: Not on file  Physical Activity: Not on file  Stress: Not on file  Social Connections: Not on file  Intimate Partner Violence: Not on file    Outpatient Medications Prior to Visit  Medication Sig Dispense Refill   albuterol (VENTOLIN HFA) 108 (90 Base) MCG/ACT inhaler INHALE 2 PUFFS INTO THE LUNGS EVERY 6 (SIX) HOURS AS NEEDED FOR WHEEZING OR SHORTNESS OF BREATH. 6.7 each 1   cetirizine (ZYRTEC) 10 MG tablet Take 10 mg by mouth daily.     EPINEPHrine 0.3 mg/0.3 mL IJ SOAJ injection Inject 0.3 mLs (0.3 mg total) into the muscle as needed for anaphylaxis. 1 Device 1   escitalopram (LEXAPRO) 10 MG tablet Take 1 tablet (10 mg total) by mouth daily. Needs to be seen for further refills. 90 tablet 3   fluticasone furoate-vilanterol (BREO ELLIPTA) 100-25 MCG/INH AEPB Inhale 1 puff into the lungs daily. 30 each 5   metroNIDAZOLE (FLAGYL) 500 MG tablet Take 1 tablet (500 mg total) by mouth 2 (two) times  daily. 14 tablet 0   montelukast (SINGULAIR) 10 MG tablet TAKE 1 TABLET BY MOUTH EVERYDAY AT BEDTIME 90 tablet 0   norethindrone-ethinyl estradiol-FE (JUNEL FE 1/20) 1-20 MG-MCG tablet Take 1 tablet by mouth daily. 84 tablet 4   No facility-administered medications prior to visit.    Allergies  Allergen Reactions   Penicillins Anaphylaxis    Throat swells up.   Shellfish-Derived Products Anaphylaxis   Bactrim [Sulfamethoxazole-Trimethoprim] Hives   Hydrocodone Hives   Yellow Dyes (Non-Tartrazine) Rash    Review of Systems  HENT: Negative.    Respiratory: Negative.    Cardiovascular: Negative.   Gastrointestinal: Negative.  Negative for nausea.  Genitourinary: Negative.   Skin:  Negative for rash.  All other systems reviewed and are negative.     Objective:    Physical Exam Vitals and nursing note reviewed.  Constitutional:      Appearance: Normal  appearance.  HENT:     Head: Normocephalic.     Nose: Nose normal.     Mouth/Throat:     Lips: Pink.     Mouth: Mucous membranes are moist.      Comments: Thrush present on tongue   Cardiovascular:     Rate and Rhythm: Normal rate and regular rhythm.     Pulses: Normal pulses.     Heart sounds: Normal heart sounds.  Pulmonary:     Effort: Pulmonary effort is normal.     Breath sounds: Normal breath sounds.  Abdominal:     General: Bowel sounds are normal.  Neurological:     Mental Status: She is alert and oriented to person, place, and time.    BP 110/72   Pulse 63   Temp 98 F (36.7 C) (Temporal)   Ht 5\' 2"  (1.575 m)   Wt 243 lb (110.2 kg)   SpO2 98%   BMI 44.45 kg/m  Wt Readings from Last 3 Encounters:  01/05/21 243 lb (110.2 kg)  12/21/20 244 lb (110.7 kg)  11/30/20 235 lb (106.6 kg)    Health Maintenance Due  Topic Date Due   COVID-19 Vaccine (1) Never done   HPV VACCINES (1 - 2-dose series) Never done   INFLUENZA VACCINE  12/14/2020       Topic Date Due   HPV VACCINES (1 - 2-dose series) Never done     Lab Results  Component Value Date   TSH 2.93 02/02/2017   Lab Results  Component Value Date   WBC 16.9 (H) 11/24/2018   HGB 14.2 11/24/2018   HCT 42.3 11/24/2018   MCV 81.3 11/24/2018   PLT 406 (H) 11/24/2018   Lab Results  Component Value Date   NA 138 11/24/2018   K 3.5 11/24/2018   CO2 19 (L) 11/24/2018   GLUCOSE 125 (H) 11/24/2018   BUN 6 11/24/2018   CREATININE 0.76 11/24/2018   BILITOT 0.6 11/24/2018   ALKPHOS 62 11/24/2018   AST 25 11/24/2018   ALT 29 11/24/2018   PROT 7.9 11/24/2018   ALBUMIN 4.1 11/24/2018   CALCIUM 8.8 (L) 11/24/2018   ANIONGAP 12 11/24/2018   Lab Results  Component Value Date   CHOL 136 02/02/2017   Lab Results  Component Value Date   HDL 42 (L) 02/02/2017   Lab Results  Component Value Date   LDLCALC 68 02/02/2017   Lab Results  Component Value Date   TRIG 182 (H) 02/02/2017   Lab Results   Component Value Date   CHOLHDL 3.2 02/02/2017  Lab Results  Component Value Date   HGBA1C 5.3 02/02/2017       Assessment & Plan:   Problem List Items Addressed This Visit       Digestive   Oral yeast infection - Primary    Completed assessment, patient is hormone coated with white thrush.  Education provided to patient printed handouts given.  Patient is unable to take nystatin due to yellow dye allergy.  Diflucan 150 mg tablet by mouth once.  May repeat dose if symptoms not resolved.  Advised patient to follow-up with on unresolved or worsening symptoms.  Patient verbalized understanding. Rx sent to pharmacy.      Relevant Medications   fluconazole (DIFLUCAN) 150 MG tablet     Meds ordered this encounter  Medications   fluconazole (DIFLUCAN) 150 MG tablet    Sig: Take 1 tablet (150 mg total) by mouth once for 1 dose.    Dispense:  1 tablet    Refill:  0    Patient is allergic to yellow dye.    Order Specific Question:   Supervising Provider    Answer:   Raliegh Ip [[id number]]     Daryll Drown, NP

## 2021-01-11 ENCOUNTER — Encounter: Payer: Self-pay | Admitting: Family Medicine

## 2021-01-12 ENCOUNTER — Encounter: Payer: Self-pay | Admitting: Nurse Practitioner

## 2021-01-12 ENCOUNTER — Ambulatory Visit (INDEPENDENT_AMBULATORY_CARE_PROVIDER_SITE_OTHER): Payer: Managed Care, Other (non HMO) | Admitting: Nurse Practitioner

## 2021-01-12 DIAGNOSIS — F410 Panic disorder [episodic paroxysmal anxiety] without agoraphobia: Secondary | ICD-10-CM

## 2021-01-12 NOTE — Assessment & Plan Note (Signed)
Panic attack not well controlled.  Continue Lexapro as prescribed, stress management, psychotherapy/cognitive behavioral therapy recommended.  Patient unable to take Atarax due to yellow dye allergies.

## 2021-01-12 NOTE — Progress Notes (Signed)
   Virtual Visit  Note Due to COVID-19 pandemic this visit was conducted virtually. This visit type was conducted due to national recommendations for restrictions regarding the COVID-19 Pandemic (e.g. social distancing, sheltering in place) in an effort to limit this patient's exposure and mitigate transmission in our community. All issues noted in this document were discussed and addressed.  A physical exam was not performed with this format.  I connected with [name] on 01/12/21 at 4:30 PM by telephone and verified that I am speaking with the correct person using two identifiers. [address] is currently located at home during visit. The provider, Daryll Drown, NP is located in their office at time of visit.  I discussed the limitations, risks, security and privacy concerns of performing an evaluation and management service by telephone and the availability of in person appointments. I also discussed with the patient that there may be a patient responsible charge related to this service. The patient expressed understanding and agreed to proceed.   History and Present Illness:  HPI  Anxiety: Patient complains of anxiety disorder.  She has the following symptoms: difficulty concentrating, irritable. Onset of symptoms was approximately 1 day ago, unchanged since that time. She denies current suicidal and homicidal ideation. Family history significant for anxiety and depression.Possible organic causes contributing are: none. Risk factors: positive family history in  father and previous episode of depression Previous treatment includes Lexapro and  lexapro .  She complains of the following side effects from the treatment: none.   Review of Systems  Constitutional: Negative.   HENT: Negative.    Cardiovascular: Negative.   Skin: Negative.   Psychiatric/Behavioral:  The patient is nervous/anxious.   All other systems reviewed and are negative.   Observations/Objective: Televisit patient  not in distress  Assessment and Plan: Panic attack not well controlled.  Continue Lexapro as prescribed, stress management, psychotherapy/cognitive behavioral therapy recommended.  Patient unable to take Atarax due to yellow dye allergies.  Follow Up Instructions: Follow-up with worsening unresolved symptoms.    I discussed the assessment and treatment plan with the patient. The patient was provided an opportunity to ask questions and all were answered. The patient agreed with the plan and demonstrated an understanding of the instructions.   The patient was advised to call back or seek an in-person evaluation if the symptoms worsen or if the condition fails to improve as anticipated.  The above assessment and management plan was discussed with the patient. The patient verbalized understanding of and has agreed to the management plan. Patient is aware to call the clinic if symptoms persist or worsen. Patient is aware when to return to the clinic for a follow-up visit. Patient educated on when it is appropriate to go to the emergency department.   Time call ended: 4:40 PM  I provided 10 minutes of  non face-to-face time during this encounter.    Daryll Drown, NP

## 2021-01-13 ENCOUNTER — Other Ambulatory Visit: Payer: Self-pay | Admitting: Nurse Practitioner

## 2021-01-13 ENCOUNTER — Encounter: Payer: Self-pay | Admitting: Family Medicine

## 2021-01-13 ENCOUNTER — Telehealth: Payer: Self-pay | Admitting: Family Medicine

## 2021-01-13 DIAGNOSIS — F418 Other specified anxiety disorders: Secondary | ICD-10-CM

## 2021-01-13 MED ORDER — HYDROXYZINE HCL 10 MG PO TABS: 10.0000 mg | ORAL_TABLET | Freq: Three times a day (TID) | ORAL | 0 refills | Status: DC | PRN

## 2021-01-22 ENCOUNTER — Other Ambulatory Visit: Payer: Self-pay | Admitting: Family Medicine

## 2021-01-28 ENCOUNTER — Encounter: Payer: Self-pay | Admitting: Family Medicine

## 2021-01-28 ENCOUNTER — Ambulatory Visit (INDEPENDENT_AMBULATORY_CARE_PROVIDER_SITE_OTHER): Payer: Managed Care, Other (non HMO) | Admitting: Family Medicine

## 2021-01-28 DIAGNOSIS — F3181 Bipolar II disorder: Secondary | ICD-10-CM | POA: Diagnosis not present

## 2021-01-28 DIAGNOSIS — F418 Other specified anxiety disorders: Secondary | ICD-10-CM | POA: Diagnosis not present

## 2021-01-28 MED ORDER — OLANZAPINE 5 MG PO TABS
5.0000 mg | ORAL_TABLET | Freq: Every day | ORAL | 2 refills | Status: DC
Start: 2021-01-28 — End: 2021-02-22

## 2021-01-28 NOTE — Progress Notes (Signed)
Virtual Visit via telephone Note  I connected with Nichole Guerra on 01/28/21 at 0919 by telephone and verified that I am speaking with the correct person using two identifiers. Nichole Guerra is currently located at work and patient are currently with her during visit. The provider, Elige Radon Orien Mayhall, MD is located in their office at time of visit.  Call ended at 0932  I discussed the limitations, risks, security and privacy concerns of performing an evaluation and management service by telephone and the availability of in person appointments. I also discussed with the patient that there may be a patient responsible charge related to this service. The patient expressed understanding and agreed to proceed.   History and Present Illness: Depression and bipolar She says the Abilify is making her too sleepy in the next morning.  She is taking it at night and she is still having some having some panic attacks and it has increased lately. She is still taking lexapro at 10 mg and denies side effects. She denies any suicidal ideations.  She says she has not gained any weight and denies any other side effects from the Lexapro makes her too sleepy.  She does feel like its not working as well because she is having more panic attacks.  1. Depression with anxiety   2. Bipolar 2 disorder Nichole Guerra)     Outpatient Encounter Medications as of 01/28/2021  Medication Sig   OLANZapine (ZYPREXA) 5 MG tablet Take 1 tablet (5 mg total) by mouth at bedtime.   albuterol (VENTOLIN HFA) 108 (90 Base) MCG/ACT inhaler INHALE 2 PUFFS INTO THE LUNGS EVERY 6 (SIX) HOURS AS NEEDED FOR WHEEZING OR SHORTNESS OF BREATH.   cetirizine (ZYRTEC) 10 MG tablet Take 10 mg by mouth daily.   EPINEPHrine 0.3 mg/0.3 mL IJ SOAJ injection Inject 0.3 mLs (0.3 mg total) into the muscle as needed for anaphylaxis.   escitalopram (LEXAPRO) 10 MG tablet Take 1 tablet (10 mg total) by mouth daily. Needs to be seen for further refills.   fluticasone  furoate-vilanterol (BREO ELLIPTA) 100-25 MCG/INH AEPB Inhale 1 puff into the lungs daily.   hydrOXYzine (ATARAX/VISTARIL) 10 MG tablet Take 1 tablet (10 mg total) by mouth 3 (three) times daily as needed.   metroNIDAZOLE (FLAGYL) 500 MG tablet Take 1 tablet (500 mg total) by mouth 2 (two) times daily.   montelukast (SINGULAIR) 10 MG tablet TAKE 1 TABLET BY MOUTH EVERYDAY AT BEDTIME   norethindrone-ethinyl estradiol-FE (JUNEL FE 1/20) 1-20 MG-MCG tablet Take 1 tablet by mouth daily.   No facility-administered encounter medications on file as of 01/28/2021.    Review of Systems  Respiratory:  Negative for chest tightness and shortness of breath.   Cardiovascular:  Negative for chest pain and leg swelling.  Psychiatric/Behavioral:  Positive for dysphoric mood and sleep disturbance. Negative for agitation, behavioral problems, self-injury and suicidal ideas. The patient is nervous/anxious.   All other systems reviewed and are negative.  Observations/Objective: Patient sounds comfortable and in no acute distress  Assessment and Plan: Problem List Items Addressed This Visit       Other   Depression with anxiety - Primary   Relevant Medications   OLANZapine (ZYPREXA) 5 MG tablet   Bipolar 2 disorder (HCC)   Relevant Medications   OLANZapine (ZYPREXA) 5 MG tablet    Switch from Abilify to Zyprexa, continue Lexapro. Follow up plan: Return if symptoms worsen or fail to improve, for 1-2 month depression.     I discussed the assessment  and treatment plan with the patient. The patient was provided an opportunity to ask questions and all were answered. The patient agreed with the plan and demonstrated an understanding of the instructions.   The patient was advised to call back or seek an in-person evaluation if the symptoms worsen or if the condition fails to improve as anticipated.  The above assessment and management plan was discussed with the patient. The patient verbalized understanding  of and has agreed to the management plan. Patient is aware to call the clinic if symptoms persist or worsen. Patient is aware when to return to the clinic for a follow-up visit. Patient educated on when it is appropriate to go to the emergency department.    I provided 13 minutes of non-face-to-face time during this encounter.    Nils Pyle, MD

## 2021-02-03 ENCOUNTER — Encounter: Payer: Self-pay | Admitting: Family

## 2021-02-03 ENCOUNTER — Encounter: Payer: Self-pay | Admitting: Family Medicine

## 2021-02-03 ENCOUNTER — Ambulatory Visit (INDEPENDENT_AMBULATORY_CARE_PROVIDER_SITE_OTHER): Payer: Managed Care, Other (non HMO) | Admitting: Family

## 2021-02-03 DIAGNOSIS — J069 Acute upper respiratory infection, unspecified: Secondary | ICD-10-CM | POA: Diagnosis not present

## 2021-02-03 MED ORDER — FLUTICASONE PROPIONATE 50 MCG/ACT NA SUSP: 2.0000 | Freq: Every day | NASAL | 6 refills | Status: DC

## 2021-02-03 NOTE — Progress Notes (Signed)
Virtual Visit  Note Due to COVID-19 pandemic this visit was conducted virtually. This visit type was conducted due to national recommendations for restrictions regarding the COVID-19 Pandemic (e.g. social distancing, sheltering in place) in an effort to limit this patient's exposure and mitigate transmission in our community. All issues noted in this document were discussed and addressed.  A physical exam was not performed with this format.  I connected with Nichole Guerra on 02/03/21 at 3:33 pm  by telephone and verified that I am speaking with the correct person using two identifiers. Nichole Guerra is currently located at home and no one is currently with her during visit. The provider, Jannifer Rodney, FNP is located in their office at time of visit.  I discussed the limitations, risks, security and privacy concerns of performing an evaluation and management service by telephone and the availability of in person appointments. I also discussed with the patient that there may be a patient responsible charge related to this service. The patient expressed understanding and agreed to proceed.  Nichole Guerra, Nichole Guerra are scheduled for a virtual visit with your provider today.    Just as we do with appointments in the office, we must obtain your consent to participate.  Your consent will be active for this visit and any virtual visit you may have with one of our providers in the next 365 days.    If you have a MyChart account, I can also send a copy of this consent to you electronically.  All virtual visits are billed to your insurance company just like a traditional visit in the office.  As this is a virtual visit, video technology does not allow for your provider to perform a traditional examination.  This may limit your provider's ability to fully assess your condition.  If your provider identifies any concerns that need to be evaluated in person or the need to arrange testing such as labs, EKG, etc, we will make  arrangements to do so.    Although advances in technology are sophisticated, we cannot ensure that it will always work on either your end or our end.  If the connection with a video visit is poor, we may have to switch to a telephone visit.  With either a video or telephone visit, we are not always able to ensure that we have a secure connection.   I need to obtain your verbal consent now.   Are you willing to proceed with your visit today?   Nichole Guerra has provided verbal consent on 02/03/2021 for a virtual visit (video or telephone).   Jannifer Rodney, [place] 02/03/2021  3:32 PM    History and Present Illness:  Pt calls the office today with sinus issues that started today and was sent home from work.  Sinusitis This is a new problem. The current episode started today. The problem is unchanged. There has been no fever. She is experiencing no pain. Associated symptoms include congestion, coughing, sinus pressure and sneezing. Pertinent negatives include no chills, ear pain, headaches, shortness of breath, sore throat or swollen glands. Past treatments include nothing. The treatment provided no relief.     Review of Systems  Constitutional:  Negative for chills.  HENT:  Positive for congestion, sinus pressure and sneezing. Negative for ear pain and sore throat.   Respiratory:  Positive for cough. Negative for shortness of breath.   Neurological:  Negative for headaches.  All other systems reviewed and are negative.   Observations/Objective: No SOB or distress  noted, nasal voice  Assessment and Plan: 1. URI with cough and congestion - Take meds as prescribed - Use a cool mist humidifier  -Use saline nose sprays frequently -Force fluids -For any cough or congestion  Use plain Mucinex- regular strength or max strength is fine -For fever or aces or pains- take tylenol or ibuprofen. -Throat lozenges if help PT will come get COVID tested tomorrow. Work noted given  - fluticasone  (FLONASE) 50 MCG/ACT nasal spray; Place 2 sprays into both nostrils daily.  Dispense: 16 g; Refill: 6    I discussed the assessment and treatment plan with the patient. The patient was provided an opportunity to ask questions and all were answered. The patient agreed with the plan and demonstrated an understanding of the instructions.   The patient was advised to call back or seek an in-person evaluation if the symptoms worsen or if the condition fails to improve as anticipated.  The above assessment and management plan was discussed with the patient. The patient verbalized understanding of and has agreed to the management plan. Patient is aware to call the clinic if symptoms persist or worsen. Patient is aware when to return to the clinic for a follow-up visit. Patient educated on when it is appropriate to go to the emergency department.   Time call ended:  3:44 pm   I provided 11 minutes of  non face-to-face time during this encounter.    Jannifer Rodney, FNP

## 2021-02-04 ENCOUNTER — Other Ambulatory Visit: Payer: Managed Care, Other (non HMO)

## 2021-02-04 NOTE — Addendum Note (Signed)
Addended by: Julious Payer D on: 02/04/2021 11:12 AM   Modules accepted: Orders

## 2021-02-04 NOTE — Addendum Note (Signed)
Addended by: Francee Nodal on: 02/04/2021 11:19 AM   Modules accepted: Orders

## 2021-02-05 LAB — SARS-COV-2, NAA 2 DAY TAT

## 2021-02-05 LAB — NOVEL CORONAVIRUS, NAA: SARS-CoV-2, NAA: NOT DETECTED

## 2021-02-11 ENCOUNTER — Other Ambulatory Visit: Payer: Self-pay

## 2021-02-11 ENCOUNTER — Ambulatory Visit: Payer: No Typology Code available for payment source | Admitting: Family Medicine

## 2021-02-11 ENCOUNTER — Encounter: Payer: Self-pay | Admitting: Family Medicine

## 2021-02-11 ENCOUNTER — Ambulatory Visit (INDEPENDENT_AMBULATORY_CARE_PROVIDER_SITE_OTHER): Payer: Managed Care, Other (non HMO)

## 2021-02-11 VITALS — BP 131/93 | HR 75 | Ht 62.0 in | Wt 240.0 lb

## 2021-02-11 DIAGNOSIS — W19XXXA Unspecified fall, initial encounter: Secondary | ICD-10-CM

## 2021-02-11 DIAGNOSIS — Z23 Encounter for immunization: Secondary | ICD-10-CM

## 2021-02-11 DIAGNOSIS — M25561 Pain in right knee: Secondary | ICD-10-CM | POA: Diagnosis not present

## 2021-02-11 NOTE — Progress Notes (Signed)
BP (!) 131/93   Pulse 75   Ht 5\' 2"  (1.575 m)   Wt 240 lb (108.9 kg)   LMP 01/11/2021 (Approximate)   SpO2 96%   BMI 43.90 kg/m    Subjective:   Patient ID: 01/13/2021, female    DOB: 1995-06-07, 25 y.o.   MRN: [id number]  HPI: Nichole Guerra is a 25 y.o. female presenting on 02/11/2021 for Fall (Injured right knee)   HPI Patient is seen in today for right knee pain. She says she had a fell last night down some steps onto some concrete on the right knee and landed with her whole weight on that knee have a lot of pain in the front of her kneecap in the front of her right knee and having some pain when she pushes on the gas pedal and stands on it as well.  She denies any numbness or weakness or shooting pain anywhere else in her leg except in that knee itself.  She is having some swelling there just wanted to get it looked at to make sure nothing was broken.  Relevant past medical, surgical, family and social history reviewed and updated as indicated. Interim medical history since our last visit reviewed. Allergies and medications reviewed and updated.  Review of Systems  Constitutional:  Negative for activity change and appetite change.  Musculoskeletal:  Positive for arthralgias, joint swelling and myalgias. Negative for gait problem.   Per HPI unless specifically indicated above   Allergies as of 02/11/2021       Reactions   Penicillins Anaphylaxis   Throat swells up.   Shellfish-derived Products Anaphylaxis   Bactrim [sulfamethoxazole-trimethoprim] Hives   Hydrocodone Hives   Yellow Dyes (non-tartrazine) Rash        Medication List        Accurate as of February 11, 2021  3:59 PM. If you have any questions, ask your nurse or doctor.          STOP taking these medications    metroNIDAZOLE 500 MG tablet Commonly known as: FLAGYL Stopped by: February 13, 2021 Milla Wahlberg, MD       TAKE these medications    albuterol 108 (90 Base) MCG/ACT inhaler Commonly known  as: VENTOLIN HFA INHALE 2 PUFFS INTO THE LUNGS EVERY 6 (SIX) HOURS AS NEEDED FOR WHEEZING OR SHORTNESS OF BREATH.   cetirizine 10 MG tablet Commonly known as: ZYRTEC Take 10 mg by mouth daily.   EPINEPHrine 0.3 mg/0.3 mL Soaj injection Commonly known as: EPI-PEN Inject 0.3 mLs (0.3 mg total) into the muscle as needed for anaphylaxis.   escitalopram 10 MG tablet Commonly known as: LEXAPRO Take 1 tablet (10 mg total) by mouth daily. Needs to be seen for further refills.   fluticasone 50 MCG/ACT nasal spray Commonly known as: FLONASE Place 2 sprays into both nostrils daily.   fluticasone furoate-vilanterol 100-25 MCG/INH Aepb Commonly known as: Breo Ellipta Inhale 1 puff into the lungs daily.   hydrOXYzine 10 MG tablet Commonly known as: ATARAX/VISTARIL Take 1 tablet (10 mg total) by mouth 3 (three) times daily as needed.   montelukast 10 MG tablet Commonly known as: SINGULAIR TAKE 1 TABLET BY MOUTH EVERYDAY AT BEDTIME   norethindrone-ethinyl estradiol-FE 1-20 MG-MCG tablet Commonly known as: Junel FE 1/20 Take 1 tablet by mouth daily.   OLANZapine 5 MG tablet Commonly known as: ZyPREXA Take 1 tablet (5 mg total) by mouth at bedtime.         Objective:   BP 2/20)  131/93   Pulse 75   Ht 5\' 2"  (1.575 m)   Wt 240 lb (108.9 kg)   LMP 01/11/2021 (Approximate)   SpO2 96%   BMI 43.90 kg/m   Wt Readings from Last 3 Encounters:  02/11/21 240 lb (108.9 kg)  01/05/21 243 lb (110.2 kg)  12/21/20 244 lb (110.7 kg)    Physical Exam Vitals and nursing note reviewed.  Constitutional:      Appearance: Normal appearance.  Musculoskeletal:     Right knee: Swelling, ecchymosis and bony tenderness (Tenderness over patella and anterior tibial tuberosity) present. No crepitus. Normal range of motion. No tenderness. No LCL laxity, MCL laxity, ACL laxity or PCL laxity. Normal meniscus.     Instability Tests: Anterior drawer test negative. Posterior drawer test negative.   Neurological:     Mental Status: She is alert.    Right knee x-ray: No acute bony abnormalities, soft tissue swelling, await final read from radiologist.  Assessment & Plan:   Problem List Items Addressed This Visit   None Visit Diagnoses     Fall, initial encounter    -  Primary   Relevant Orders   DG Knee 1-2 Views Right   Acute pain of right knee       Relevant Orders   DG Knee 1-2 Views Right       Recommended anti-inflammatories and ice and stretching. Follow up plan: Return if symptoms worsen or fail to improve.  Counseling provided for all of the vaccine components Orders Placed This Encounter  Procedures   DG Knee 1-2 Views Right    02/20/21, MD [facility] 02/11/2021, 3:59 PM

## 2021-02-20 ENCOUNTER — Other Ambulatory Visit: Payer: Self-pay | Admitting: Family Medicine

## 2021-02-20 DIAGNOSIS — F418 Other specified anxiety disorders: Secondary | ICD-10-CM

## 2021-02-20 DIAGNOSIS — F3181 Bipolar II disorder: Secondary | ICD-10-CM

## 2021-02-22 ENCOUNTER — Encounter: Payer: Self-pay | Admitting: Family Medicine

## 2021-02-23 ENCOUNTER — Ambulatory Visit: Payer: Managed Care, Other (non HMO) | Admitting: Family Medicine

## 2021-02-24 ENCOUNTER — Encounter: Payer: Self-pay | Admitting: Family Medicine

## 2021-03-30 ENCOUNTER — Encounter: Payer: Self-pay | Admitting: Family Medicine

## 2021-03-30 ENCOUNTER — Ambulatory Visit (INDEPENDENT_AMBULATORY_CARE_PROVIDER_SITE_OTHER): Payer: Managed Care, Other (non HMO) | Admitting: Family Medicine

## 2021-03-30 DIAGNOSIS — K529 Noninfective gastroenteritis and colitis, unspecified: Secondary | ICD-10-CM

## 2021-03-30 MED ORDER — ONDANSETRON 8 MG PO TBDP: 8.0000 mg | ORAL_TABLET | Freq: Four times a day (QID) | ORAL | 1 refills | Status: DC | PRN

## 2021-03-30 NOTE — Progress Notes (Signed)
Subjective:    Patient ID: Nichole Guerra, female    DOB: 05-27-1995, 25 y.o.   MRN: [id number]   HPI: Nichole Guerra is a 25 y.o. female presenting for vomiting. Onset this AM. Holding down fluids. No fever. No diarrhea. Abd pain with eating, periumbilical.    Depression screen [facility] 2/9 02/11/2021 02/11/2021 01/05/2021 04/17/2020 08/06/2018  Decreased Interest 3 0 0 0 0  Down, Depressed, Hopeless 2 0 0 0 0  PHQ - 2 Score 5 0 0 0 0  Altered sleeping 2 - - - 0  Tired, decreased energy 1 - - - 0  Change in appetite 1 - - - 0  Feeling bad or failure about yourself  1 - - - 0  Trouble concentrating 3 - - - 0  Moving slowly or fidgety/restless 2 - - - 0  Suicidal thoughts 0 - - - 0  PHQ-9 Score 15 - - - 0     Relevant past medical, surgical, family and social history reviewed and updated as indicated.  Interim medical history since our last visit reviewed. Allergies and medications reviewed and updated.  ROS:  Review of Systems  Constitutional:  Negative for appetite change, chills, diaphoresis, fatigue and fever.  HENT:  Positive for rhinorrhea. Negative for congestion, ear pain, hearing loss, sore throat and trouble swallowing.   Respiratory:  Negative for cough, chest tightness and shortness of breath.   Cardiovascular:  Negative for chest pain and palpitations.  Gastrointestinal:  Positive for abdominal pain.  Musculoskeletal:  Negative for arthralgias.  Skin:  Negative for rash.    Social History   Tobacco Use  Smoking Status Never  Smokeless Tobacco Never       Objective:     Wt Readings from Last 3 Encounters:  02/11/21 240 lb (108.9 kg)  01/05/21 243 lb (110.2 kg)  12/21/20 244 lb (110.7 kg)     Exam deferred. Pt. Harboring due to COVID 19. Phone visit performed.   Assessment & Plan:   1. Gastroenteritis, acute     Meds ordered this encounter  Medications   ondansetron (ZOFRAN-ODT) 8 MG disintegrating tablet    Sig: Take 1 tablet (8 mg total) by mouth  every 6 (six) hours as needed for nausea or vomiting.    Dispense:  20 tablet    Refill:  1    No orders of the defined types were placed in this encounter.     Diagnoses and all orders for this visit:  Gastroenteritis, acute  Other orders -     ondansetron (ZOFRAN-ODT) 8 MG disintegrating tablet; Take 1 tablet (8 mg total) by mouth every 6 (six) hours as needed for nausea or vomiting.   Virtual Visit via telephone Note  I discussed the limitations, risks, security and privacy concerns of performing an evaluation and management service by telephone and the availability of in person appointments. The patient was identified with two identifiers. Pt.expressed understanding and agreed to proceed. Pt. Is at home. Dr. Darlyn Read is in his office.  Follow Up Instructions:   I discussed the assessment and treatment plan with the patient. The patient was provided an opportunity to ask questions and all were answered. The patient agreed with the plan and demonstrated an understanding of the instructions.   The patient was advised to call back or seek an in-person evaluation if the symptoms worsen or if the condition fails to improve as anticipated.   Total minutes including chart review and phone contact time: 6  Follow up plan: Return if symptoms worsen or fail to improve.  Mechele Claude, MD [facility]

## 2021-03-31 NOTE — Telephone Encounter (Signed)
Dr. Louanne Skye,  Are you alright to give work note?

## 2021-04-01 ENCOUNTER — Encounter: Payer: Self-pay | Admitting: Family Medicine

## 2021-04-26 ENCOUNTER — Other Ambulatory Visit: Payer: Self-pay | Admitting: Family Medicine

## 2021-05-13 ENCOUNTER — Encounter: Payer: Self-pay | Admitting: Nurse Practitioner

## 2021-05-13 ENCOUNTER — Ambulatory Visit (INDEPENDENT_AMBULATORY_CARE_PROVIDER_SITE_OTHER): Payer: Managed Care, Other (non HMO) | Admitting: Nurse Practitioner

## 2021-05-13 DIAGNOSIS — Z20822 Contact with and (suspected) exposure to covid-19: Secondary | ICD-10-CM | POA: Diagnosis not present

## 2021-05-13 NOTE — Patient Instructions (Signed)
10 Things You Can Do to Manage Your COVID-19 Symptoms at Home ?If you have possible or confirmed COVID-19 ?Stay home except to get medical care. ?Monitor your symptoms carefully. If your symptoms get worse, call your healthcare provider immediately. ?Get rest and stay hydrated. ?If you have a medical appointment, call the healthcare provider ahead of time and tell them that you have or may have COVID-19. ?For medical emergencies, call 911 and notify the dispatch personnel that you have or may have COVID-19. ?Cover your cough and sneezes with a tissue or use the inside of your elbow. ?Wash your hands often with soap and water for at least 20 seconds or clean your hands with an alcohol-based hand sanitizer that contains at least 60% alcohol. ?As much as possible, stay in a specific room and away from other people in your home. Also, you should use a separate bathroom, if available. If you need to be around other people in or outside of the home, wear a mask. ?Avoid sharing personal items with other people in your household, like dishes, towels, and bedding. ?Clean all surfaces that are touched often, like counters, tabletops, and doorknobs. Use household cleaning sprays or wipes according to the label instructions. ?cdc.gov/coronavirus ?11/29/2019 ?This information is not intended to replace advice given to you by your health care provider. Make sure you discuss any questions you have with your health care provider. ?Document Revised: 01/22/2021 Document Reviewed: 01/22/2021 ?Elsevier Patient Education ? 2022 Elsevier Inc. ? ?

## 2021-05-13 NOTE — Assessment & Plan Note (Signed)
Patient exposed to COVID-19.  No signs or symptoms of COVID at this time patient is needing testing to return back to work.

## 2021-05-13 NOTE — Progress Notes (Signed)
° °  Virtual Visit  Note Due to COVID-19 pandemic this visit was conducted virtually. This visit type was conducted due to national recommendations for restrictions regarding the COVID-19 Pandemic (e.g. social distancing, sheltering in place) in an effort to limit this patient's exposure and mitigate transmission in our community. All issues noted in this document were discussed and addressed.  A physical exam was not performed with this format.  I connected with Nichole Guerra on 05/13/21 at 4:30 PM by telephone and verified that I am speaking with the correct person using two identifiers. Nichole Guerra is currently located at home during visit. The provider, Daryll Drown, NP is located in their office at time of visit.  I discussed the limitations, risks, security and privacy concerns of performing an evaluation and management service by telephone and the availability of in person appointments. I also discussed with the patient that there may be a patient responsible charge related to this service. The patient expressed understanding and agreed to proceed.   History and Present Illness:  HPI Patient exposed to COVID-19.  No signs or symptoms of COVID at this time patient is needing testing to return back to work.   Review of Systems  Constitutional: Negative.   HENT: Negative.    Eyes: Negative.   Respiratory: Negative.    Cardiovascular: Negative.   Gastrointestinal: Negative.   Skin: Negative.   All other systems reviewed and are negative.   Observations/Objective: Televisit patient not in distress.  Assessment and Plan: Patient exposed to COVID-19.  No signs or symptoms of COVID at this time patient is needing testing to return back to work.  Follow Up Instructions: Follow-up with worsening unresolved symptoms.    I discussed the assessment and treatment plan with the patient. The patient was provided an opportunity to ask questions and all were answered. The patient agreed  with the plan and demonstrated an understanding of the instructions.   The patient was advised to call back or seek an in-person evaluation if the symptoms worsen or if the condition fails to improve as anticipated.  The above assessment and management plan was discussed with the patient. The patient verbalized understanding of and has agreed to the management plan. Patient is aware to call the clinic if symptoms persist or worsen. Patient is aware when to return to the clinic for a follow-up visit. Patient educated on when it is appropriate to go to the emergency department.   Time call ended:   4:36 PM I provided 6 minutes of  non face-to-face time during this encounter.    Daryll Drown, NP

## 2021-05-15 LAB — SARS-COV-2, NAA 2 DAY TAT

## 2021-05-15 LAB — NOVEL CORONAVIRUS, NAA: SARS-CoV-2, NAA: NOT DETECTED

## 2021-05-17 ENCOUNTER — Other Ambulatory Visit: Payer: Self-pay | Admitting: Family Medicine

## 2021-05-17 DIAGNOSIS — F3181 Bipolar II disorder: Secondary | ICD-10-CM

## 2021-05-17 DIAGNOSIS — F418 Other specified anxiety disorders: Secondary | ICD-10-CM

## 2021-06-02 ENCOUNTER — Other Ambulatory Visit: Payer: Self-pay | Admitting: Family Medicine

## 2021-06-02 DIAGNOSIS — F418 Other specified anxiety disorders: Secondary | ICD-10-CM

## 2021-06-16 ENCOUNTER — Other Ambulatory Visit: Payer: Self-pay | Admitting: Family Medicine

## 2021-06-16 DIAGNOSIS — F418 Other specified anxiety disorders: Secondary | ICD-10-CM

## 2021-08-01 ENCOUNTER — Other Ambulatory Visit: Payer: Self-pay | Admitting: Family Medicine

## 2021-08-01 DIAGNOSIS — F418 Other specified anxiety disorders: Secondary | ICD-10-CM

## 2021-08-02 MED ORDER — ESCITALOPRAM OXALATE 10 MG PO TABS: 10.0000 mg | ORAL_TABLET | Freq: Every day | ORAL | 0 refills | Status: DC

## 2021-08-24 ENCOUNTER — Other Ambulatory Visit: Payer: Self-pay | Admitting: Family Medicine

## 2021-08-24 DIAGNOSIS — F418 Other specified anxiety disorders: Secondary | ICD-10-CM

## 2021-08-25 ENCOUNTER — Encounter: Payer: Self-pay | Admitting: Family Medicine

## 2021-08-25 NOTE — Telephone Encounter (Signed)
Called to schedule appt, no answer. Letter sent.  

## 2021-08-25 NOTE — Telephone Encounter (Signed)
[employer]. NTBS 30 days given 08/02/21 ?

## 2021-08-27 ENCOUNTER — Other Ambulatory Visit: Payer: Self-pay | Admitting: Family Medicine

## 2021-08-27 DIAGNOSIS — F418 Other specified anxiety disorders: Secondary | ICD-10-CM

## 2021-09-07 ENCOUNTER — Telehealth: Payer: Self-pay | Admitting: Family Medicine

## 2021-09-07 DIAGNOSIS — F418 Other specified anxiety disorders: Secondary | ICD-10-CM

## 2021-09-07 MED ORDER — ESCITALOPRAM OXALATE 10 MG PO TABS
10.0000 mg | ORAL_TABLET | Freq: Every day | ORAL | 0 refills | Status: DC
Start: 2021-09-07 — End: 2021-09-17

## 2021-09-07 NOTE — Telephone Encounter (Signed)
Patient made appt to see Dr. Warrick Parisian on 09/17/21.  She is out of her escitalopram  and norethindrone-ethinyl estradiol-FE.  Could she get enough filled until her appointment? ?

## 2021-09-07 NOTE — Telephone Encounter (Signed)
Pt aware refill for Escitalopram sent to [facility] ?

## 2021-09-08 ENCOUNTER — Telehealth: Payer: Self-pay | Admitting: Family Medicine

## 2021-09-08 NOTE — Telephone Encounter (Signed)
Needs to be seen

## 2021-09-08 NOTE — Telephone Encounter (Signed)
Can you do a work note so pt can return to work ASAP? Appt is scheduled for May 5th with [employer] to discuss Lexapro. ?

## 2021-09-08 NOTE — Telephone Encounter (Signed)
Ok to provide note to return if well ?

## 2021-09-09 ENCOUNTER — Ambulatory Visit: Payer: Managed Care, Other (non HMO) | Admitting: Family Medicine

## 2021-09-09 NOTE — Telephone Encounter (Signed)
Lm making pt aware

## 2021-09-13 ENCOUNTER — Other Ambulatory Visit: Payer: Self-pay | Admitting: Family Medicine

## 2021-09-13 ENCOUNTER — Other Ambulatory Visit: Payer: Self-pay | Admitting: Family

## 2021-09-13 DIAGNOSIS — J069 Acute upper respiratory infection, unspecified: Secondary | ICD-10-CM

## 2021-09-17 ENCOUNTER — Ambulatory Visit (INDEPENDENT_AMBULATORY_CARE_PROVIDER_SITE_OTHER): Payer: 59 | Admitting: Family Medicine

## 2021-09-17 VITALS — BP 122/87 | HR 82 | Ht 62.0 in | Wt 247.0 lb

## 2021-09-17 DIAGNOSIS — Z9109 Other allergy status, other than to drugs and biological substances: Secondary | ICD-10-CM | POA: Diagnosis not present

## 2021-09-17 DIAGNOSIS — F3181 Bipolar II disorder: Secondary | ICD-10-CM | POA: Diagnosis not present

## 2021-09-17 DIAGNOSIS — J4540 Moderate persistent asthma, uncomplicated: Secondary | ICD-10-CM | POA: Diagnosis not present

## 2021-09-17 DIAGNOSIS — F418 Other specified anxiety disorders: Secondary | ICD-10-CM | POA: Diagnosis not present

## 2021-09-17 MED ORDER — SERTRALINE HCL 50 MG PO TABS: 50.0000 mg | ORAL_TABLET | Freq: Every day | ORAL | 0 refills | Status: DC

## 2021-09-17 MED ORDER — FLUTICASONE FUROATE-VILANTEROL 100-25 MCG/ACT IN AEPB: 1.0000 | INHALATION_SPRAY | Freq: Every day | RESPIRATORY_TRACT | 3 refills | Status: DC

## 2021-09-17 MED ORDER — OLANZAPINE 5 MG PO TABS: 5.0000 mg | ORAL_TABLET | Freq: Every day | ORAL | 1 refills | Status: DC

## 2021-09-17 MED ORDER — MONTELUKAST SODIUM 10 MG PO TABS: 10.0000 mg | ORAL_TABLET | Freq: Every day | ORAL | 1 refills | Status: DC

## 2021-09-17 NOTE — Progress Notes (Signed)
? ?BP 122/87   Pulse 82   Ht 5\' 2"  (1.575 m)   Wt 247 lb (112 kg)   SpO2 97%   BMI 45.18 kg/m?   ? ?Subjective:  ? ?Patient ID: Nichole Guerra, female    DOB: [birth year], 26 y.o.   MRN: [id number] ? ?HPI: ?Nichole Guerra is a 26 y.o. female presenting on 09/17/2021 for Medical Management of Chronic Issues, Anxiety, and Depression ? ? ?HPI ?Depression and anxiety and bipolar 2 recheck ?Patient is coming in for depression anxiety and bipolar 2 recheck.  She had been on Zyprexa and Lexapro but then she had run out and said she has been off of them for a month and then she just started a month over the past 2 weeks.  She denies any suicidal ideations or thoughts of hurting self but she says even before she did this change she felt like it was not working as well as it was previously and she was having a lot more fatigue and she is thinking of wanting to change.  She still is good on the Zyprexa but its more the Lexapro she is thinking that it is just not working as well as it used to. ? ?  09/17/2021  ?  8:28 AM 09/17/2021  ?  8:27 AM 02/11/2021  ?  5:07 PM 02/11/2021  ?  3:56 PM 01/05/2021  ?  9:23 AM  ?Depression screen PHQ 2/9  ?Decreased Interest  1 3 0 0  ?Down, Depressed, Hopeless  0 2 0 0  ?PHQ - 2 Score  1 5 0 0  ?Altered sleeping 1  2    ?Tired, decreased energy 1  1    ?Change in appetite 0  1    ?Feeling bad or failure about yourself  0  1    ?Trouble concentrating 0  3    ?Moving slowly or fidgety/restless 0  2    ?Suicidal thoughts 0  0    ?PHQ-9 Score   15    ?Difficult doing work/chores Somewhat difficult      ?  ? ?Relevant past medical, surgical, family and social history reviewed and updated as indicated. Interim medical history since our last visit reviewed. ?Allergies and medications reviewed and updated. ? ?Review of Systems  ?Constitutional:  Negative for chills and fever.  ?Eyes:  Negative for visual disturbance.  ?Respiratory:  Negative for chest tightness and shortness of breath.   ?Cardiovascular:   Negative for chest pain and leg swelling.  ?Skin:  Negative for rash.  ?Neurological:  Negative for dizziness, light-headedness and headaches.  ?Psychiatric/Behavioral:  Positive for decreased concentration. Negative for agitation, behavioral problems, self-injury, sleep disturbance and suicidal ideas. The patient is nervous/anxious.   ?All other systems reviewed and are negative. ? ?Per HPI unless specifically indicated above ? ? ?Allergies as of 09/17/2021   ? ?   Reactions  ? Penicillins Anaphylaxis  ? Throat swells up.  ? Shellfish-derived Products Anaphylaxis  ? Bactrim [sulfamethoxazole-trimethoprim] Hives  ? Hydrocodone Hives  ? Yellow Dyes (non-tartrazine) Rash  ? ?  ? ?  ?Medication List  ?  ? ?  ? Accurate as of Sep 17, 2021  8:40 AM. If you have any questions, ask your nurse or doctor.  ?  ?  ? ?  ? ?STOP taking these medications   ? ?escitalopram 10 MG tablet ?Commonly known as: LEXAPRO ?Stopped by: Nils PyleJoshua A Myya Meenach, MD ?  ?ondansetron 8 MG disintegrating tablet ?Commonly known as: ZOFRAN-ODT ?  Stopped by: Nils Pyle, MD ?  ? ?  ? ?TAKE these medications   ? ?albuterol 108 (90 Base) MCG/ACT inhaler ?Commonly known as: VENTOLIN HFA ?INHALE 2 PUFFS INTO THE LUNGS EVERY 6 (SIX) HOURS AS NEEDED FOR WHEEZING OR SHORTNESS OF BREATH. ?  ?cetirizine 10 MG tablet ?Commonly known as: ZYRTEC ?Take 10 mg by mouth daily. ?  ?EPINEPHrine 0.3 mg/0.3 mL Soaj injection ?Commonly known as: EPI-PEN ?Inject 0.3 mLs (0.3 mg total) into the muscle as needed for anaphylaxis. ?  ?fluticasone 50 MCG/ACT nasal spray ?Commonly known as: FLONASE ?Place 2 sprays into both nostrils daily. ?  ?fluticasone furoate-vilanterol 100-25 MCG/ACT Aepb ?Commonly known as: BREO ELLIPTA ?Inhale 1 puff into the lungs daily. ?What changed: medication strength ?Changed by: Nils Pyle, MD ?  ?hydrOXYzine 10 MG tablet ?Commonly known as: ATARAX ?Take 1 tablet (10 mg total) by mouth 3 (three) times daily as needed. ?  ?montelukast 10 MG  tablet ?Commonly known as: SINGULAIR ?Take 1 tablet (10 mg total) by mouth at bedtime. ?What changed: See the new instructions. ?Changed by: Nils Pyle, MD ?  ?norethindrone-ethinyl estradiol-FE 1-20 MG-MCG tablet ?Commonly known as: Junel FE 1/20 ?Take 1 tablet by mouth daily. ?  ?OLANZapine 5 MG tablet ?Commonly known as: ZYPREXA ?Take 1 tablet (5 mg total) by mouth at bedtime. DX: F41.8, F31.8 ?  ?sertraline 50 MG tablet ?Commonly known as: Zoloft ?Take 1 tablet (50 mg total) by mouth daily. ?Started by: Nils Pyle, MD ?  ? ?  ? ? ? ?Objective:  ? ?BP 122/87   Pulse 82   Ht 5\' 2"  (1.575 m)   Wt 247 lb (112 kg)   SpO2 97%   BMI 45.18 kg/m?   ?Wt Readings from Last 3 Encounters:  ?09/17/21 247 lb (112 kg)  ?02/11/21 240 lb (108.9 kg)  ?01/05/21 243 lb (110.2 kg)  ?  ?Physical Exam ?Vitals and nursing note reviewed.  ?Constitutional:   ?   General: She is not in acute distress. ?   Appearance: She is well-developed. She is not diaphoretic.  ?Eyes:  ?   Conjunctiva/sclera: Conjunctivae normal.  ?Cardiovascular:  ?   Rate and Rhythm: Normal rate and regular rhythm.  ?   Heart sounds: Normal heart sounds. No murmur heard. ?Pulmonary:  ?   Effort: Pulmonary effort is normal. No respiratory distress.  ?   Breath sounds: Normal breath sounds. No wheezing.  ?Musculoskeletal:     ?   General: No tenderness. Normal range of motion.  ?Skin: ?   General: Skin is warm and dry.  ?   Findings: No rash.  ?Neurological:  ?   Mental Status: She is alert and oriented to person, place, and time.  ?   Coordination: Coordination normal.  ?Psychiatric:     ?   Mood and Affect: Mood is anxious and depressed.     ?   Behavior: Behavior normal.     ?   Thought Content: Thought content does not include suicidal ideation. Thought content does not include suicidal plan.     ?   Cognition and Memory: Memory is not impaired.  ? ? ? ? ?Assessment & Plan:  ? ?Problem List Items Addressed This Visit   ? ?  ? Respiratory  ?  Asthma, moderate persistent  ? Relevant Medications  ? montelukast (SINGULAIR) 10 MG tablet  ? fluticasone furoate-vilanterol (BREO ELLIPTA) 100-25 MCG/ACT AEPB  ?  ? Other  ? Depression with  anxiety - Primary  ? Relevant Medications  ? OLANZapine (ZYPREXA) 5 MG tablet  ? sertraline (ZOLOFT) 50 MG tablet  ? Other Relevant Orders  ? CBC with Differential/Platelet  ? Thyroid Panel With TSH  ? Bipolar 2 disorder (HCC)  ? Relevant Medications  ? OLANZapine (ZYPREXA) 5 MG tablet  ? sertraline (ZOLOFT) 50 MG tablet  ? Other Relevant Orders  ? CBC with Differential/Platelet  ? Thyroid Panel With TSH  ? Environmental allergies  ? Relevant Medications  ? montelukast (SINGULAIR) 10 MG tablet  ?  ?Allergies have been doing good, refill medicines for asthma and allergies including Breo and Singulair. ? ?We will continue the Zyprexa but we will switch her from Lexapro to Zoloft to see if it helps boost.  She has previously done good on Zoloft in the past but then it stopped working and she thinks that is what the Lexapro is doing now. ?Follow up plan: ?Return if symptoms worsen or fail to improve, for 1 to 7-month anxiety and depression recheck. ? ?Counseling provided for all of the vaccine components ?Orders Placed This Encounter  ?Procedures  ? CBC with Differential/Platelet  ? Thyroid Panel With TSH  ? ? ?Arville Care, MD ?[facility] ?09/17/2021, 8:40 AM ? ? ? ? ?

## 2021-09-18 LAB — THYROID PANEL WITH TSH
Free Thyroxine Index: 1.1 — ABNORMAL LOW (ref 1.2–4.9)
T3 Uptake Ratio: 16 % — ABNORMAL LOW (ref 24–39)
T4, Total: 6.6 ug/dL (ref 4.5–12.0)
TSH: 3.96 u[IU]/mL (ref 0.450–4.500)

## 2021-09-18 LAB — CBC WITH DIFFERENTIAL/PLATELET
Basophils Absolute: 0.1 10*3/uL (ref 0.0–0.2)
Basos: 1 %
EOS (ABSOLUTE): 0.9 10*3/uL — ABNORMAL HIGH (ref 0.0–0.4)
Eos: 11 %
Hematocrit: 40.8 % (ref 34.0–46.6)
Hemoglobin: 13.8 g/dL (ref 11.1–15.9)
Immature Grans (Abs): 0.1 10*3/uL (ref 0.0–0.1)
Immature Granulocytes: 1 %
Lymphocytes Absolute: 2.4 10*3/uL (ref 0.7–3.1)
Lymphs: 30 %
MCH: 28.2 pg (ref 26.6–33.0)
MCHC: 33.8 g/dL (ref 31.5–35.7)
MCV: 83 fL (ref 79–97)
Monocytes Absolute: 0.8 10*3/uL (ref 0.1–0.9)
Monocytes: 10 %
Neutrophils Absolute: 4 10*3/uL (ref 1.4–7.0)
Neutrophils: 47 %
Platelets: 322 10*3/uL (ref 150–450)
RBC: 4.9 x10E6/uL (ref 3.77–5.28)
RDW: 13.1 % (ref 11.7–15.4)
WBC: 8.2 10*3/uL (ref 3.4–10.8)

## 2021-10-12 ENCOUNTER — Other Ambulatory Visit: Payer: Self-pay | Admitting: Nurse Practitioner

## 2021-10-12 DIAGNOSIS — F418 Other specified anxiety disorders: Secondary | ICD-10-CM

## 2021-10-12 MED ORDER — HYDROXYZINE HCL 10 MG PO TABS: 10.0000 mg | ORAL_TABLET | Freq: Three times a day (TID) | ORAL | 0 refills | Status: DC | PRN

## 2021-11-03 ENCOUNTER — Encounter: Payer: Self-pay | Admitting: Family Medicine

## 2021-11-03 ENCOUNTER — Telehealth (INDEPENDENT_AMBULATORY_CARE_PROVIDER_SITE_OTHER): Payer: 59 | Admitting: Family Medicine

## 2021-11-03 DIAGNOSIS — J019 Acute sinusitis, unspecified: Secondary | ICD-10-CM | POA: Diagnosis not present

## 2021-11-03 MED ORDER — DOXYCYCLINE HYCLATE 100 MG PO TABS: 100.0000 mg | ORAL_TABLET | Freq: Two times a day (BID) | ORAL | 0 refills | Status: AC

## 2021-11-03 NOTE — Progress Notes (Signed)
Virtual Visit via Telephone Note  I connected with Nichole Guerra on 11/03/21 at 4:51 PM by telephone and verified that I am speaking with the correct person using two identifiers. [place] in [facility] and nobody is currently with her during this visit. The provider, Gwenlyn Fudge, FNP is located in their office at time of visit.  I discussed the limitations, risks, security and privacy concerns of performing an evaluation and management service by telephone and the availability of in person appointments. I also discussed with the patient that there may be a patient responsible charge related to this service. The patient expressed understanding and agreed to proceed.  Subjective: PCP: Dettinger, Elige Radon, MD  Chief Complaint  Patient presents with   Sore Throat   Patient complains of cough, head congestion, headache, sore throat, ear pain/pressure, facial pain/pressure, and postnasal drainage. Onset of symptoms was 2 days ago, gradually worsening since that time. She is drinking plenty of fluids. Evaluation to date: none. Treatment to date:  Dayquil . She has a history of asthma. She does not smoke.    ROS: Per HPI  Current Outpatient Medications:    albuterol (VENTOLIN HFA) 108 (90 Base) MCG/ACT inhaler, INHALE 2 PUFFS INTO THE LUNGS EVERY 6 (SIX) HOURS AS NEEDED FOR WHEEZING OR SHORTNESS OF BREATH., Disp: 6.7 each, Rfl: 1   cetirizine (ZYRTEC) 10 MG tablet, Take 10 mg by mouth daily., Disp: , Rfl:    EPINEPHrine 0.3 mg/0.3 mL IJ SOAJ injection, Inject 0.3 mLs (0.3 mg total) into the muscle as needed for anaphylaxis., Disp: 1 Device, Rfl: 1   fluticasone (FLONASE) 50 MCG/ACT nasal spray, Place 2 sprays into both nostrils daily., Disp: 16 g, Rfl: 6   fluticasone furoate-vilanterol (BREO ELLIPTA) 100-25 MCG/ACT AEPB, Inhale 1 puff into the lungs daily., Disp: 90 each, Rfl: 3   hydrOXYzine (ATARAX) 10 MG tablet, Take 1 tablet (10 mg total) by mouth 3 (three)  times daily as needed., Disp: 30 tablet, Rfl: 0   montelukast (SINGULAIR) 10 MG tablet, Take 1 tablet (10 mg total) by mouth at bedtime., Disp: 90 tablet, Rfl: 1   norethindrone-ethinyl estradiol-FE (JUNEL FE 1/20) 1-20 MG-MCG tablet, Take 1 tablet by mouth daily., Disp: 84 tablet, Rfl: 4   OLANZapine (ZYPREXA) 5 MG tablet, Take 1 tablet (5 mg total) by mouth at bedtime. DX: F41.8, F31.8, Disp: 90 tablet, Rfl: 1   sertraline (ZOLOFT) 50 MG tablet, Take 1 tablet (50 mg total) by mouth daily., Disp: 90 tablet, Rfl: 0  Allergies  Allergen Reactions   Penicillins Anaphylaxis    Throat swells up.   Shellfish-Derived Products Anaphylaxis   Bactrim [Sulfamethoxazole-Trimethoprim] Hives   Hydrocodone Hives   Yellow Dyes (Non-Tartrazine) Rash   Past Medical History:  Diagnosis Date   Allergy    Anxiety    Asthma    Cardiac arrest [facility])    Contraceptive management 12/17/2012   Depression     Observations/Objective: A&O  No respiratory distress or wheezing audible over the phone Mood, judgement, and thought processes all WNL  Assessment and Plan: 1. Acute non-recurrent sinusitis, unspecified location Continue symptom management.  Education provided on sinus infections. - doxycycline (VIBRA-TABS) 100 MG tablet; Take 1 tablet (100 mg total) by mouth 2 (two) times daily for 7 days.  Dispense: 14 tablet; Refill: 0   Follow Up Instructions:  I discussed the assessment and treatment plan with the patient. The patient was provided an opportunity to ask questions  and all were answered. The patient agreed with the plan and demonstrated an understanding of the instructions.   The patient was advised to call back or seek an in-person evaluation if the symptoms worsen or if the condition fails to improve as anticipated.  The above assessment and management plan was discussed with the patient. The patient verbalized understanding of and has agreed to the management plan. Patient is aware to call the  clinic if symptoms persist or worsen. Patient is aware when to return to the clinic for a follow-up visit. Patient educated on when it is appropriate to go to the emergency department.   Time call ended: 5:02 PM  I provided 11 minutes of non-face-to-face time during this encounter.  Deliah Boston, MSN, APRN, FNP-C [facility] 11/03/21

## 2021-11-08 ENCOUNTER — Ambulatory Visit: Payer: 59 | Admitting: Family Medicine

## 2021-11-08 ENCOUNTER — Encounter: Payer: Self-pay | Admitting: Family Medicine

## 2021-11-10 ENCOUNTER — Encounter: Payer: Self-pay | Admitting: Family Medicine

## 2021-11-24 ENCOUNTER — Other Ambulatory Visit: Payer: Self-pay | Admitting: Allergy & Immunology

## 2021-11-24 ENCOUNTER — Ambulatory Visit (INDEPENDENT_AMBULATORY_CARE_PROVIDER_SITE_OTHER): Payer: 59 | Admitting: Allergy & Immunology

## 2021-11-24 ENCOUNTER — Encounter: Payer: Self-pay | Admitting: Allergy & Immunology

## 2021-11-24 VITALS — BP 126/84 | HR 73 | Temp 99.1°F | Resp 18 | Ht 63.5 in | Wt 239.0 lb

## 2021-11-24 DIAGNOSIS — J3089 Other allergic rhinitis: Secondary | ICD-10-CM | POA: Diagnosis not present

## 2021-11-24 DIAGNOSIS — T7800XA Anaphylactic reaction due to unspecified food, initial encounter: Secondary | ICD-10-CM

## 2021-11-24 DIAGNOSIS — J4540 Moderate persistent asthma, uncomplicated: Secondary | ICD-10-CM

## 2021-11-24 DIAGNOSIS — T7800XD Anaphylactic reaction due to unspecified food, subsequent encounter: Secondary | ICD-10-CM

## 2021-11-24 DIAGNOSIS — J302 Other seasonal allergic rhinitis: Secondary | ICD-10-CM

## 2021-11-24 MED ORDER — RYALTRIS 665-25 MCG/ACT NA SUSP: 2.0000 | Freq: Two times a day (BID) | NASAL | 5 refills | Status: DC | PRN

## 2021-11-24 MED ORDER — FEXOFENADINE HCL 180 MG PO TABS: 180.0000 mg | ORAL_TABLET | Freq: Two times a day (BID) | ORAL | 5 refills | Status: AC | PRN

## 2021-11-24 NOTE — Patient Instructions (Addendum)
1. Seasonal and perennial allergic rhinitis - Testing today showed: grasses, ragweed, weeds, trees, indoor molds, outdoor molds, dust mites, cat, dog, cockroach, and hamster, [place] pig, rabbit . - Copy of test results provided.  - Avoidance measures provided. - Continue with: Singulair (montelukast) 10mg  daily - Start taking: Allegra (fexofenadine) 180mg  tablet TWICE daily and [name] (olopatadine/mometasone) two sprays per nostril 1-2 times daily as needed - You can use an extra dose of the antihistamine, if needed, for breakthrough symptoms.  - Consider nasal saline rinses 1-2 times daily to remove allergens from the nasal cavities as well as help with mucous clearance (this is especially helpful to do before the nasal sprays are given) - Strongly allergy shots as a means of long-term control. - Allergy shots "re-train" and "reset" the immune system to ignore environmental allergens and decrease the resulting immune response to those allergens (sneezing, itchy watery eyes, runny nose, nasal congestion, etc).    - Allergy shots improve symptoms in 75-85% of patients.  - Check CPT codes with your insurance company and get back to with a decision.   2. Anaphylactic shock due to food - Testing was positive to all of the shellfish, except for scallops. - Copy of testing results provided. - Unfortunately, shellfish is fairly  3. Moderate persistent asthma, uncomplicated - Testing looked great. - We are not going to make any changes since your symptoms are under good control. - Daily controller medication(s): Breo 100/42mcg one puff once daily - Prior to physical activity: albuterol 2 puffs 10-15 minutes before physical activity. - Rescue medications: albuterol 4 puffs every 4-6 hours as needed - Asthma control goals:  * Full participation in all desired activities (may need albuterol before activity) * Albuterol use two time or less a week on average (not counting use with activity) *  Cough interfering with sleep two time or less a month * Oral steroids no more than once a year * No hospitalizations   4. Return in about 2 months (around 01/25/2022).    Please inform 32m of any Emergency Department visits, hospitalizations, or changes in symptoms. Call 03/27/2022 before going to the ED for breathing or allergy symptoms since we might be able to fit you in for a sick visit. Feel free to contact us anytime with any questions, problems, or concerns.  It was a pleasure to meet you and your family today!  Websites that have reliable patient information: 1. American Academy of Asthma, Allergy, and Immunology: www.aaaai.org 2. Food Allergy Research and Education (FARE): foodallergy.org 3. Mothers of Asthmatics: http://www.asthmacommunitynetwork.org 4. American College of Allergy, Asthma, and Immunology: www.acaai.org   COVID-19 Vaccine Information can be found at: [place] For questions related to vaccine distribution or appointments, please email [email or web address] or call [phone].   We realize that you might be concerned about having an allergic reaction to the COVID19 vaccines. To help with that concern, WE ARE OFFERING THE COVID19 VACCINES IN OUR OFFICE! Ask the front desk for dates!     "Like" [email or web address] on Facebook and Instagram for our latest updates!      A healthy democracy works best when [phone] participate! Make sure you are registered to vote! If you have moved or changed any of your contact information, you will need to get this updated before voting!  In some cases, you MAY be able to register to vote online: [place]       Airborne Adult Perc - 11/24/21 1000     Time Antigen Placed 1012  Allergen Manufacturer Greer    Location Back    Number of Test 59    1. Control-Buffer 50% Glycerol Negative    2. Control-Histamine 1 mg/ml 3+    3.  Albumin saline 2+    4. [place] 4+    5. [place] 4+    6. [place] 4+    7. [place] Blue 2+    8. Meadow Fescue 3+    9. Perennial Rye 4+    10. Sweet Vernal 4+    11. Timothy Negative    12. Cocklebur Negative    13. Burweed [place] 2+    14. Ragweed, short 2+    15. Ragweed, Giant 2+    16. Plantain,  English 2+    17. Lamb's Quarters 2+    18. Sheep Sorrell Negative    19. Rough Pigweed Negative    20. Marsh Elder, Rough Negative    21. Mugwort, Common Negative    22. Ash mix 2+    23. Birch mix 4+    24. Beech American 3+    25. Box, Elder 2+    26. Cedar, red 3+    27. Cottonwood, [place] Negative    28. Elm mix Negative    29. Hickory 3+    30. Maple mix 2+    31. Oak, [place] mix 3+    32. Pecan Pollen 4+    33. Pine mix Negative    34. Sycamore Eastern 2+    35. [place], Black Pollen 4+    36. Alternaria alternata Negative    37. Cladosporium Herbarum 2+    38. Aspergillus mix Negative    39. Penicillium mix Negative    40. Bipolaris sorokiniana (Helminthosporium) Negative    41. Drechslera spicifera (Curvularia) Negative    42. Mucor plumbeus Negative    43. Fusarium moniliforme Negative    44. Aureobasidium pullulans (pullulara) Negative    45. Rhizopus oryzae Negative    46. Botrytis cinera Negative    47. Epicoccum nigrum Negative    48. Phoma betae Negative    49. Candida Albicans Negative    50. Trichophyton mentagrophytes Negative    51. Mite, D Farinae  5,000 AU/ml 4+    52. Mite, D Pteronyssinus  5,000 AU/ml 4+    53. Cat Hair 10,000 BAU/ml Negative    54.  Dog Epithelia Negative    55. Mixed Feathers 2+    56. Horse Epithelia 3+    57. Cockroach, German 4+    58. Mouse 2+    59. Tobacco Leaf Negative             Intradermal - 11/24/21 1112     Time Antigen Placed 1100    Allergen Manufacturer Greer    Location Arm    Number of Test 6    Control Negative    Mold 2 2+    Mold 3 3+    Mold 4 3+    Cat 4+    Dog 4+              Food Adult Perc - 11/24/21 1000     Time Antigen Placed 1013    Allergen Manufacturer Lavella Hammock    Location Back    Number of allergen test 11    8. Shellfish Mix --   7x13   25. Shrimp --   5x7   26. Crab --   5x7   27. Lobster --   8x14   28.  Oyster --   4x4   29. Scallops Negative    1. Hamster 2+    2. [place] Pig 2+    3. Rabbit 3+    4. Gerbil Negative    5. Rat Negative           .  Reducing Pollen Exposure  The American Academy of Allergy, Asthma and Immunology suggests the following steps to reduce your exposure to pollen during allergy seasons.    Do not hang sheets or clothing out to dry; pollen may collect on these items. Do not mow lawns or spend time around freshly cut grass; mowing stirs up pollen. Keep windows closed at night.  Keep car windows closed while driving. Minimize morning activities outdoors, a time when pollen counts are usually at their highest. Stay indoors as much as possible when pollen counts or humidity is high and on windy days when pollen tends to remain in the air longer. Use air conditioning when possible.  Many air conditioners have filters that trap the pollen spores. Use a HEPA room air filter to remove pollen form the indoor air you breathe.   Control of Mold Allergen   Mold and fungi can grow on a variety of surfaces provided certain temperature and moisture conditions exist.  Outdoor molds grow on plants, decaying vegetation and soil.  The major outdoor mold, Alternaria and Cladosporium, are found in very high numbers during hot and dry conditions.  Generally, a late Summer - Fall peak is seen for common outdoor fungal spores.  Rain will temporarily lower outdoor mold spore count, but counts rise rapidly when the rainy period ends.  The most important indoor molds are Aspergillus and Penicillium.  Dark, humid and poorly ventilated basements are ideal sites for mold growth.  The next most common sites of mold growth are the bathroom  and the kitchen.  Outdoor (Seasonal) Mold Control  Positive outdoor molds via skin testing: Alternaria, Cladosporium, Bipolaris (Helminthsporium), Drechslera (Curvalaria), and Mucor  Use air conditioning and keep windows closed Avoid exposure to decaying vegetation. Avoid leaf raking. Avoid grain handling. Consider wearing a face mask if working in moldy areas.    Indoor (Perennial) Mold Control   Positive indoor molds via skin testing: Aspergillus, Penicillium, Fusarium, Aureobasidium (Pullulara), and Rhizopus  Maintain humidity below 50%. Clean washable surfaces with 5% bleach solution. Remove sources e.g. contaminated carpets.    Control of Dog or Cat Allergen  Avoidance is the best way to manage a dog or cat allergy. If you have a dog or cat and are allergic to dog or cats, consider removing the dog or cat from the home. If you have a dog or cat but don't want to find it a new home, or if your family wants a pet even though someone in the household is allergic, here are some strategies that may help keep symptoms at bay:  Keep the pet out of your bedroom and restrict it to only a few rooms. Be advised that keeping the dog or cat in only one room will not limit the allergens to that room. Don't pet, hug or kiss the dog or cat; if you do, wash your hands with soap and water. High-efficiency particulate air (HEPA) cleaners run continuously in a bedroom or living room can reduce allergen levels over time. Regular use of a high-efficiency vacuum cleaner or a central vacuum can reduce allergen levels. Giving your dog or cat a bath at least once a week can reduce airborne allergen.  Control of Dust Mite Allergen    Dust mites play a major role in allergic asthma and rhinitis.  They occur in environments with high humidity wherever human skin is found.  Dust mites absorb humidity from the atmosphere (ie, they do not drink) and feed on organic matter (including shed human and animal  skin).  Dust mites are a microscopic type of insect that you cannot see with the naked eye.  High levels of dust mites have been detected from mattresses, pillows, carpets, upholstered furniture, bed covers, clothes, soft toys and any woven material.  The principal allergen of the dust mite is found in its feces.  A gram of dust may contain 1,000 mites and 250,000 fecal particles.  Mite antigen is easily measured in the air during house cleaning activities.  Dust mites do not bite and do not cause harm to humans, other than by triggering allergies/asthma.    Ways to decrease your exposure to dust mites in your home:  Encase mattresses, box springs and pillows with a mite-impermeable barrier or cover   Wash sheets, blankets and drapes weekly in hot water (130 F) with detergent and dry them in a dryer on the hot setting.  Have the room cleaned frequently with a vacuum cleaner and a damp dust-mop.  For carpeting or rugs, vacuuming with a vacuum cleaner equipped with a high-efficiency particulate air (HEPA) filter.  The dust mite allergic individual should not be in a room which is being cleaned and should wait 1 hour after cleaning before going into the room. Do not sleep on upholstered furniture (eg, couches).   If possible removing carpeting, upholstered furniture and drapery from the home is ideal.  Horizontal blinds should be eliminated in the rooms where the person spends the most time (bedroom, study, television room).  Washable vinyl, roller-type shades are optimal. Remove all non-washable stuffed toys from the bedroom.  Wash stuffed toys weekly like sheets and blankets above.   Reduce indoor humidity to less than 50%.  Inexpensive humidity monitors can be purchased at most hardware stores.  Do not use a humidifier as can make the problem worse and are not recommended.  Control of Cockroach Allergen  Cockroach allergen has been identified as an important cause of acute attacks of asthma, especially  in urban settings.  There are fifty-five species of cockroach that exist in the [place], however only three, the [place], [place] species produce allergen that can affect patients with Asthma.  Allergens can be obtained from fecal particles, egg casings and secretions from cockroaches.    Remove food sources. Reduce access to water. Seal access and entry points. Spray runways with 0.5-1% Diazinon or Chlorpyrifos Blow boric acid power under stoves and refrigerator. Place bait stations (hydramethylnon) at feeding sites.  Allergy Shots   Allergies are the result of a chain reaction that starts in the immune system. Your immune system controls how your body defends itself. For instance, if you have an allergy to pollen, your immune system identifies pollen as an invader or allergen. Your immune system overreacts by producing antibodies called Immunoglobulin E (IgE). These antibodies travel to cells that release chemicals, causing an allergic reaction.  The concept behind allergy immunotherapy, whether it is received in the form of shots or tablets, is that the immune system can be desensitized to specific allergens that trigger allergy symptoms. Although it requires time and patience, the payback can be long-term relief.  How Do Allergy Shots Work?  Allergy shots  work much like a vaccine. Your body responds to injected amounts of a particular allergen given in increasing doses, eventually developing a resistance and tolerance to it. Allergy shots can lead to decreased, minimal or no allergy symptoms.  There generally are two phases: build-up and maintenance. Build-up often ranges from three to six months and involves receiving injections with increasing amounts of the allergens. The shots are typically given once or twice a week, though more rapid build-up schedules are sometimes used.  The maintenance phase begins when the most effective dose is reached. This dose is different  for each person, depending on how allergic you are and your response to the build-up injections. Once the maintenance dose is reached, there are longer periods between injections, typically two to four weeks.  Occasionally doctors give cortisone-type shots that can temporarily reduce allergy symptoms. These types of shots are different and should not be confused with allergy immunotherapy shots.  Who Can Be Treated with Allergy Shots?  Allergy shots may be a good treatment approach for people with allergic rhinitis (hay fever), allergic asthma, conjunctivitis (eye allergy) or stinging insect allergy.   Before deciding to begin allergy shots, you should consider:   The length of allergy season and the severity of your symptoms  Whether medications and/or changes to your environment can control your symptoms  Your desire to avoid long-term medication use  Time: allergy immunotherapy requires a major time commitment  Cost: may vary depending on your insurance coverage  Allergy shots for children age 26 and older are effective and often well tolerated. They might prevent the onset of new allergen sensitivities or the progression to asthma.  Allergy shots are not started on patients who are pregnant but can be continued on patients who become pregnant while receiving them. In some patients with other medical conditions or who take certain common medications, allergy shots may be of risk. It is important to mention other medications you talk to your allergist.   When Will I Feel Better?  Some may experience decreased allergy symptoms during the build-up phase. For others, it may take as long as 12 months on the maintenance dose. If there is no improvement after a year of maintenance, your allergist will discuss other treatment options with you.  If you aren't responding to allergy shots, it may be because there is not enough dose of the allergen in your vaccine or there are missing allergens that  were not identified during your allergy testing. Other reasons could be that there are high levels of the allergen in your environment or major exposure to non-allergic triggers like tobacco smoke.  What Is the Length of Treatment?  Once the maintenance dose is reached, allergy shots are generally continued for three to five years. The decision to stop should be discussed with your allergist at that time. Some people may experience a permanent reduction of allergy symptoms. Others may relapse and a longer course of allergy shots can be considered.  What Are the Possible Reactions?  The two types of adverse reactions that can occur with allergy shots are local and systemic. Common local reactions include very mild redness and swelling at the injection site, which can happen immediately or several hours after. A systemic reaction, which is less common, affects the entire body or a particular body system. They are usually mild and typically respond quickly to medications. Signs include increased allergy symptoms such as sneezing, a stuffy nose or hives.  Rarely, a serious systemic reaction called  anaphylaxis can develop. Symptoms include swelling in the throat, wheezing, a feeling of tightness in the chest, nausea or dizziness. Most serious systemic reactions develop within 30 minutes of allergy shots. This is why it is strongly recommended you wait in your doctor's office for 30 minutes after your injections. Your allergist is trained to watch for reactions, and his or her staff is trained and equipped with the proper medications to identify and treat them.  Who Should Administer Allergy Shots?  The preferred location for receiving shots is your prescribing allergist's office. Injections can sometimes be given at another facility where the physician and staff are trained to recognize and treat reactions, and have received instructions by your prescribing allergist.

## 2021-11-24 NOTE — Progress Notes (Signed)
NEW PATIENT  Date of Service/Encounter:  11/24/21  Consult requested by: Guerra, Elige RadonJoshua A, MD   Assessment:   Moderate persistent asthma, uncomplicated  Seasonal and perennial allergic rhinitis  Anaphylactic shock due to food (shellfish)   Plan/Recommendations:   1. Seasonal and perennial allergic rhinitis - Testing today showed: grasses, ragweed, weeds, trees, indoor molds, outdoor molds, dust mites, cat, dog, cockroach, and hamster, Israelgu[place] pig, rabbit . - Copy of test results provided.  - Avoidance measures provided. - Continue with: Singulair (montelukast) 10mg  daily - Start taking: Allegra (fexofenadine) 180mg  tablet TWICE daily and [name] (olopatadine/mometasone) two sprays per nostril 1-2 times daily as needed - You can use an extra dose of the antihistamine, if needed, for breakthrough symptoms.  - Consider nasal saline rinses 1-2 times daily to remove allergens from the nasal cavities as well as help with mucous clearance (this is especially helpful to do before the nasal sprays are given) - Strongly allergy shots as a means of long-term control. - Allergy shots "re-train" and "reset" the immune system to ignore environmental allergens and decrease the resulting immune response to those allergens (sneezing, itchy watery eyes, runny nose, nasal congestion, etc).    - Allergy shots improve symptoms in 75-85% of patients.  - Check CPT codes with your insurance company and get back to us with a decision.   2. Anaphylactic shock due to food - Testing was positive to all of the shellfish, except for scallops. - Copy of testing results provided. - Unfortunately, shellfish is fairly  3. Moderate persistent asthma, uncomplicated - Testing looked great. - We are not going to make any changes since your symptoms are under good control. - Daily controller medication(s): Breo 100/7525mcg one puff once daily - Prior to physical activity: albuterol 2 puffs 10-15 minutes before  physical activity. - Rescue medications: albuterol 4 puffs every 4-6 hours as needed - Asthma control goals:  * Full participation in all desired activities (may need albuterol before activity) * Albuterol use two time or less a week on average (not counting use with activity) * Cough interfering with sleep two time or less a month * Oral steroids no more than once a year * No hospitalizations  4. Return in about 2 months (around 01/25/2022).    This note in its entirety was forwarded to the Provider who requested this consultation.  Subjective:   Nichole Guerra is a 26 y.o. female presenting today for evaluation of  Chief Complaint  Patient presents with   Pruritus    Has been off allergy meds and is itching all over   Allergic Rhinitis     Red,itchy,watery eyes, sneezing, stuffy nose    Urticaria    Sometimes she will get hives around animals and are itchy.    Allergy Testing    Is allergic to shrimp she will get hives, closed throat, itchy all over   Asthma    Is on Breo and feels like it is helping.     Nichole Guerra has a history of the following: Patient Active Problem List   Diagnosis Date Noted   Panic disorder 01/12/2021   Irregular periods 12/21/2020   Bipolar 2 disorder (HCC) 07/05/2018   Asthma, moderate persistent 02/02/2017   Environmental allergies 02/02/2017   Food allergy 02/02/2017   Depression with anxiety 02/02/2017   Obesity (BMI 35.0-39.9 without comorbidity) 02/02/2017    History obtained from: chart review and patient and mother.  Nichole Guerra was referred by Guerra, Elige RadonJoshua A,  MD.     [name] is a 26 y.o. female presenting for an evaluation of allergies and asthma .   Asthma/Respiratory Symptom History: She has been on an ICS since 2012. She has had asthma since she was a child. She is now on Breo one puff once daily. She was not in the hospital a lot. She was a cougher at night and she needed her albuterol frequently. Most of the asthma has been  flaring from her allergy exposures. She has had prednisone in the past but the history of this is unclear. She has not been intubated.  She thinks that she last got them six months ago. Viruses make her symptoms worse. She does get bronchitis fairly often and she gets antibiotics relatively frequently. She has had them 3-4 times this year alone.   Allergic Rhinitis Symptom History: She has allergy symptoms year round. She has tried using Flonase for her symptoms. She is also on montelukast for these symptoms. She has been on a daily antihistamine (many of them) without improvement. Benadryl helps but then it knocks her out.  She grew up in [place].   Food Allergy Symptom History: She has a history of a shrimp allergy. She grew up eating it often and then she had a reaction with throat itching around the age of 53 or 4. She was told to never eat shrimp again and she was told to never eat it again. There are no other foods that she avoids at all. She tries to avoid yellow dye because it causes her to have some hives and itching.   Skin Symptom History: She does get hives around animals and with yellow dye exposure. She does not have eczema that she knows about.   She used to work at [employer]. 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 Symptoms do not seem to change much between work and home.   She has a chinchilla and a dog. She has a chinchilla that accidentally hung himself after chewing a hole in his hammock.   Otherwise, there is no history of other atopic diseases, including drug allergies, stinging insect allergies, or contact dermatitis. There is no significant infectious history. Vaccinations are up to date.    Past Medical History: Patient Active Problem List   Diagnosis Date Noted   Panic disorder 01/12/2021   Irregular periods 12/21/2020   Bipolar 2 disorder (HCC) 07/05/2018   Asthma, moderate persistent 02/02/2017    Environmental allergies 02/02/2017   Food allergy 02/02/2017   Depression with anxiety 02/02/2017   Obesity (BMI 35.0-39.9 without comorbidity) 02/02/2017    Medication List:  Allergies as of 11/24/2021       Reactions   Penicillins Anaphylaxis   Throat swells up.   Shellfish-derived Products Anaphylaxis   Bactrim [sulfamethoxazole-trimethoprim] Hives   Hydrocodone Hives   Yellow Dyes (non-tartrazine) Rash        Medication List        Accurate as of November 24, 2021  1:36 PM. If you have any questions, ask your nurse or doctor.          albuterol 108 (90 Base) MCG/ACT inhaler Commonly known as: VENTOLIN HFA INHALE 2 PUFFS INTO THE LUNGS EVERY 6 (SIX) HOURS AS NEEDED FOR WHEEZING OR SHORTNESS OF BREATH.   cetirizine 10 MG tablet Commonly known as: ZYRTEC Take 10 mg by mouth daily.   EPINEPHrine 0.3 mg/0.3 mL Soaj injection Commonly known as: EPI-PEN Inject  0.3 mLs (0.3 mg total) into the muscle as needed for anaphylaxis.   fexofenadine 180 MG tablet Commonly known as: ALLEGRA Take 1 tablet (180 mg total) by mouth 2 (two) times daily as needed for allergies or rhinitis. Started by: Alfonse Spruce, MD   fluticasone 50 MCG/ACT nasal spray Commonly known as: FLONASE Place 2 sprays into both nostrils daily.   fluticasone furoate-vilanterol 100-25 MCG/ACT Aepb Commonly known as: BREO ELLIPTA Inhale 1 puff into the lungs daily.   hydrOXYzine 10 MG tablet Commonly known as: ATARAX Take 1 tablet (10 mg total) by mouth 3 (three) times daily as needed.   montelukast 10 MG tablet Commonly known as: SINGULAIR Take 1 tablet (10 mg total) by mouth at bedtime.   norethindrone-ethinyl estradiol-FE 1-20 MG-MCG tablet Commonly known as: Junel FE 1/20 Take 1 tablet by mouth daily.   OLANZapine 5 MG tablet Commonly known as: ZYPREXA Take 1 tablet (5 mg total) by mouth at bedtime. DX: F41.8, F31.8   [name] 665-25 MCG/ACT Susp Generic drug:  Olopatadine-Mometasone Place 2 sprays into the nose 2 (two) times daily as needed. Started by: Alfonse Spruce, MD   sertraline 50 MG tablet Commonly known as: Zoloft Take 1 tablet (50 mg total) by mouth daily.   XYZAL PO Take by mouth.        Birth History: non-contributory  Developmental History: non-contributory  Past Surgical History: History reviewed. No pertinent surgical history.   Family History: Family History  Problem Relation Age of Onset   Hypertension Mother    Other Mother        pre-cancerous cells on cervix   Asthma Father    Diabetes Father    Cancer Maternal Uncle        prostate, bladder, bone   Cancer Paternal Uncle        lung   Asthma Maternal Grandmother    Miscarriages / Stillbirths Maternal Grandmother    Hypertension Maternal Grandmother    Heart disease Maternal Grandfather    Hyperlipidemia Paternal Grandmother    Asthma Paternal Grandmother    COPD Paternal Grandmother    Stroke Paternal Grandfather    Stroke Other        paternal great grandma     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  They live in a house that was built in 1930.  There is carpeting throughout the home.  They have gas heating and central cooling.  There is 1 dog and 1 Chinchilla in the home.  There are no dust mite covers on the bedding.  There is no tobacco exposure.  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  She is not exposed to fumes, chemicals, or dust.  She does not live near an interstate or industrial area.  They do not use a HEPA filter.   Review of Systems  Constitutional: Negative.  Negative for chills, fever, malaise/fatigue and weight loss.  HENT:  Positive for congestion and sinus pain. Negative for ear discharge and ear pain.   Eyes:  Positive for pain, discharge and redness.  Respiratory:  Negative for cough, sputum production, shortness of breath and wheezing.   Cardiovascular: Negative.  Negative for chest pain and  palpitations.  Gastrointestinal:  Negative for abdominal pain, constipation, diarrhea, heartburn, nausea and vomiting.  Skin: Negative.  Negative for itching and rash.  Neurological:  Negative for dizziness and headaches.  Endo/Heme/Allergies:  Negative for environmental allergies. Does not bruise/bleed easily.  Objective:   Blood pressure 126/84, pulse 73, temperature 99.1 F (37.3 C), resp. rate 18, height 5' 3.5" (1.613 m), weight 239 lb (108.4 kg), SpO2 93 %. Body mass index is 41.67 kg/m.     Physical Exam Vitals reviewed.  Constitutional:      Appearance: She is well-developed.     Comments: Very pleasant.  Cooperative with the exam.  HENT:     Head: Normocephalic and atraumatic.     Right Ear: Tympanic membrane, ear canal and external ear normal. No drainage, swelling or tenderness. Tympanic membrane is not injected, scarred, erythematous, retracted or bulging.     Left Ear: Tympanic membrane, ear canal and external ear normal. No drainage, swelling or tenderness. Tympanic membrane is not injected, scarred, erythematous, retracted or bulging.     Nose: Mucosal edema and rhinorrhea present. No nasal deformity or septal deviation.     Right Turbinates: Enlarged, swollen and pale.     Left Turbinates: Enlarged, swollen and pale.     Right Sinus: No maxillary sinus tenderness or frontal sinus tenderness.     Left Sinus: No maxillary sinus tenderness or frontal sinus tenderness.     Comments: No nasal polyps.    Mouth/Throat:     Mouth: Mucous membranes are not pale and not dry.     Pharynx: Uvula midline.  Eyes:     General:        Right eye: No discharge.        Left eye: No discharge.     Conjunctiva/sclera: Conjunctivae normal.     Right eye: Right conjunctiva is not injected. No chemosis.    Left eye: Left conjunctiva is not injected. No chemosis.    Pupils: Pupils are equal, round, and reactive to light.  Cardiovascular:     Rate and Rhythm: Normal rate and  regular rhythm.     Heart sounds: Normal heart sounds.  Pulmonary:     Effort: Pulmonary effort is normal. No tachypnea, accessory muscle usage or respiratory distress.     Breath sounds: Normal breath sounds. No wheezing, rhonchi or rales.     Comments: Moving air well in all lung fields.  No increased work of breathing. Chest:     Chest wall: No tenderness.  Abdominal:     Tenderness: There is no abdominal tenderness. There is no guarding or rebound.  Lymphadenopathy:     Head:     Right side of head: No submandibular, tonsillar or occipital adenopathy.     Left side of head: No submandibular, tonsillar or occipital adenopathy.     Cervical: No cervical adenopathy.  Skin:    Coloration: Skin is not pale.     Findings: No abrasion, erythema, petechiae or rash. Rash is not papular, urticarial or vesicular.  Neurological:     Mental Status: She is alert.  Psychiatric:        Behavior: Behavior is cooperative.      Diagnostic studies:    Spirometry: results normal (FEV1: 2.59/82%, FVC: 3.04/83%, FEV1/FVC: 85%).    Spirometry consistent with normal pattern.   Allergy Studies:     Airborne Adult Perc - 11/24/21 1000     Time Antigen Placed 1012    Allergen Manufacturer Waynette Buttery    Location Back    Number of Test 59    1. Control-Buffer 50% Glycerol Negative    2. Control-Histamine 1 mg/ml 3+    3. Albumin saline 2+    4. Bahia 4+    5.  [place] 4+    6. Johnson 4+    7. Kentucky Blue 2+    8. Meadow Fescue 3+    9. Perennial Rye 4+    10. Sweet Vernal 4+    11. Timothy Negative    12. Cocklebur Negative    13. Burweed Marshelder 2+    14. Ragweed, short 2+    15. Ragweed, Giant 2+    16. Plantain,  English 2+    17. Lamb's Quarters 2+    18. Sheep Sorrell Negative    19. Rough Pigweed Negative    20. Marsh Elder, Rough Negative    21. Mugwort, Common Negative    22. Ash mix 2+    23. Birch mix 4+    24. Beech American 3+    25. Box, Elder 2+    26. Cedar, red  3+    27. Cottonwood, Guinea-[place]issau Negative    28. Elm mix Negative    29. Hickory 3+    30. Maple mix 2+    31. Oak, [place] mix 3+    32. Pecan Pollen 4+    33. Pine mix Negative    34. Sycamore Eastern 2+    35. Walnut, Black Pollen 4+    36. Alternaria alternata Negative    37. Cladosporium Herbarum 2+    38. Aspergillus mix Negative    39. Penicillium mix Negative    40. Bipolaris sorokiniana (Helminthosporium) Negative    41. Drechslera spicifera (Curvularia) Negative    42. Mucor plumbeus Negative    43. Fusarium moniliforme Negative    44. Aureobasidium pullulans (pullulara) Negative    45. Rhizopus oryzae Negative    46. Botrytis cinera Negative    47. Epicoccum nigrum Negative    48. Phoma betae Negative    49. Candida Albicans Negative    50. Trichophyton mentagrophytes Negative    51. Mite, D Farinae  5,000 AU/ml 4+    52. Mite, D Pteronyssinus  5,000 AU/ml 4+    53. Cat Hair 10,000 BAU/ml Negative    54.  Dog Epithelia Negative    55. Mixed Feathers 2+    56. Horse Epithelia 3+    57. Cockroach, German 4+    58. Mouse 2+    59. Tobacco Leaf Negative             Intradermal - 11/24/21 1112     Time Antigen Placed 1100    Allergen Manufacturer Greer    Location Arm    Number of Test 6    Control Negative    Mold 2 2+    Mold 3 3+    Mold 4 3+    Cat 4+    Dog 4+             Food Adult Perc - 11/24/21 1000     Time Antigen Placed 1013    Allergen Manufacturer Waynette Buttery    Location Back    Number of allergen test 11    8. Shellfish Mix --   7x13   25. Shrimp --   5x7   26. Crab --   5x7   27. Lobster --   8x14   28. Oyster --   4x4   29. Scallops Negative    1. Hamster 2+    2. Israel Pig 2+    3. Rabbit 3+    4. Gerbil Negative    5. Rat Negative  Allergy testing results were read and interpreted by myself, documented by clinical staff.         Malachi Bonds, MD [facility]

## 2021-11-24 NOTE — Addendum Note (Signed)
Addended by: Elsworth Soho on: 11/24/2021 04:36 PM   Modules accepted: Orders

## 2021-12-05 ENCOUNTER — Encounter: Payer: Self-pay | Admitting: Allergy & Immunology

## 2021-12-06 ENCOUNTER — Other Ambulatory Visit: Payer: Self-pay | Admitting: *Deleted

## 2021-12-06 MED ORDER — EPIPEN 2-PAK 0.3 MG/0.3ML IJ SOAJ: 0.3000 mg | INTRAMUSCULAR | 1 refills | Status: DC | PRN

## 2021-12-20 ENCOUNTER — Other Ambulatory Visit: Payer: Self-pay | Admitting: Family Medicine

## 2021-12-20 DIAGNOSIS — F418 Other specified anxiety disorders: Secondary | ICD-10-CM

## 2021-12-20 DIAGNOSIS — F3181 Bipolar II disorder: Secondary | ICD-10-CM

## 2022-01-12 ENCOUNTER — Ambulatory Visit (INDEPENDENT_AMBULATORY_CARE_PROVIDER_SITE_OTHER): Payer: 59 | Admitting: Family Medicine

## 2022-01-12 ENCOUNTER — Other Ambulatory Visit: Payer: Self-pay | Admitting: Family Medicine

## 2022-01-12 ENCOUNTER — Encounter: Payer: Self-pay | Admitting: Family Medicine

## 2022-01-12 ENCOUNTER — Ambulatory Visit: Payer: Self-pay | Admitting: Adult Health

## 2022-01-12 VITALS — BP 119/71 | HR 74 | Temp 98.2°F | Ht 63.5 in | Wt 241.0 lb

## 2022-01-12 DIAGNOSIS — Z23 Encounter for immunization: Secondary | ICD-10-CM

## 2022-01-12 DIAGNOSIS — F418 Other specified anxiety disorders: Secondary | ICD-10-CM

## 2022-01-12 DIAGNOSIS — F3181 Bipolar II disorder: Secondary | ICD-10-CM | POA: Diagnosis not present

## 2022-01-12 DIAGNOSIS — J4540 Moderate persistent asthma, uncomplicated: Secondary | ICD-10-CM

## 2022-01-12 MED ORDER — ALBUTEROL SULFATE HFA 108 (90 BASE) MCG/ACT IN AERS: 2.0000 | INHALATION_SPRAY | Freq: Four times a day (QID) | RESPIRATORY_TRACT | 1 refills | Status: DC | PRN

## 2022-01-12 MED ORDER — SERTRALINE HCL 100 MG PO TABS: 100.0000 mg | ORAL_TABLET | Freq: Every day | ORAL | 1 refills | Status: DC

## 2022-01-12 NOTE — Telephone Encounter (Signed)
I do not recall but I believe the patient had a problem where she could not take regular albuterol.  Please call and ask her if she had a problem with regular albuterol, if not we can send regular albuterol for her.

## 2022-01-12 NOTE — Patient Instructions (Signed)
2nd HPV due 1-2 months from today's visit. The 3rd and final dose is due 6 months from today.

## 2022-01-12 NOTE — Progress Notes (Unsigned)
BP 119/71   Pulse 74   Temp 98.2 F (36.8 C)   Ht 5' 3.5" (1.613 m)   Wt 241 lb (109.3 kg)   SpO2 97%   BMI 42.02 kg/m    Subjective:   Patient ID: Nichole Guerra, female    DOB: 11-14-1995, 26 y.o.   MRN: [id number]  HPI: Nichole Guerra is a 26 y.o. female presenting on 01/12/2022 for Medical Management of Chronic Issues, Depression, and Anxiety   HPI Depression anxiety and bipolar recheck Patient is currently for depression anxiety and bipolar recheck.  Patient is currently on Zyprexa and Zoloft and Atarax as needed.  She is not doing as well because it is is currently in a long-distance relationship where her significant other is back on the weekends and it has put a lot of strain on her and on the relationship which is caused her to feel more depression and anxiety.  She denies any suicidal ideations.    01/12/2022    8:24 AM 09/17/2021    8:28 AM 09/17/2021    8:27 AM 02/11/2021    5:07 PM 02/11/2021    3:56 PM  Depression screen PHQ 2/9  Decreased Interest 2  1 3  0  Down, Depressed, Hopeless 2  0 2 0  PHQ - 2 Score 4  1 5  0  Altered sleeping 2 1  2    Tired, decreased energy 2 1  1    Change in appetite 1 0  1   Feeling bad or failure about yourself  0 0  1   Trouble concentrating 1 0  3   Moving slowly or fidgety/restless 2 0  2   Suicidal thoughts 0 0  0   PHQ-9 Score 12   15   Difficult doing work/chores Somewhat difficult Somewhat difficult       Asthma recheck Patient is coming in for asthma recheck.  She does get allergies and asthma and uses Breo and albuterol as needed.  She is on asthma and allergist and feels like things are going really well.  Relevant past medical, surgical, family and social history reviewed and updated as indicated. Interim medical history since our last visit reviewed. Allergies and medications reviewed and updated.  Review of Systems  Constitutional:  Negative for chills and fever.  Eyes:  Negative for visual disturbance.  Respiratory:   Negative for chest tightness and shortness of breath.   Cardiovascular:  Negative for chest pain and leg swelling.  Genitourinary:  Negative for dysuria.  Skin:  Negative for rash.  Neurological:  Negative for light-headedness and headaches.  Psychiatric/Behavioral:  Positive for dysphoric mood and sleep disturbance. Negative for agitation, behavioral problems, self-injury and suicidal ideas. The patient is nervous/anxious.   All other systems reviewed and are negative.   Per HPI unless specifically indicated above   Allergies as of 01/12/2022       Reactions   Penicillins Anaphylaxis   Throat swells up.   Shellfish-derived Products Anaphylaxis   Bactrim [sulfamethoxazole-trimethoprim] Hives   Hydrocodone Hives   Yellow Dyes (non-tartrazine) Rash        Medication List        Accurate as of January 12, 2022  8:57 AM. If you have any questions, ask your nurse or doctor.          STOP taking these medications    cetirizine 10 MG tablet Commonly known as: ZYRTEC Stopped by: Aarian Griffie, MD   fluticasone 50 MCG/ACT nasal spray Commonly known  as: FLONASE Stopped by: Nils Pyle, MD   XYZAL PO Stopped by: Elige Radon Joleena Weisenburger, MD       TAKE these medications    albuterol 108 (90 Base) MCG/ACT inhaler Commonly known as: VENTOLIN HFA Inhale 2 puffs into the lungs every 6 (six) hours as needed for wheezing or shortness of breath.   EPINEPHrine 0.3 mg/0.3 mL Soaj injection Commonly known as: EPI-PEN Inject 0.3 mLs (0.3 mg total) into the muscle as needed for anaphylaxis.   EpiPen 2-Pak 0.3 mg/0.3 mL Soaj injection Generic drug: EPINEPHrine Inject 0.3 mg into the muscle as needed for anaphylaxis.   fexofenadine 180 MG tablet Commonly known as: ALLEGRA Take 1 tablet (180 mg total) by mouth 2 (two) times daily as needed for allergies or rhinitis.   fluticasone furoate-vilanterol 100-25 MCG/ACT Aepb Commonly known as: BREO ELLIPTA Inhale 1 puff into  the lungs daily.   hydrOXYzine 10 MG tablet Commonly known as: ATARAX Take 1 tablet (10 mg total) by mouth 3 (three) times daily as needed.   montelukast 10 MG tablet Commonly known as: SINGULAIR Take 1 tablet (10 mg total) by mouth at bedtime.   norethindrone-ethinyl estradiol-FE 1-20 MG-MCG tablet Commonly known as: Junel FE 1/20 Take 1 tablet by mouth daily.   OLANZapine 5 MG tablet Commonly known as: ZYPREXA Take 1 tablet (5 mg total) by mouth at bedtime. DX: F41.8, F31.8   [name] 665-25 MCG/ACT Susp Generic drug: Olopatadine-Mometasone Place 2 sprays into the nose 2 (two) times daily as needed.   sertraline 100 MG tablet Commonly known as: ZOLOFT Take 1 tablet (100 mg total) by mouth daily. What changed:  medication strength how much to take Changed by: Elige Radon Shiesha Jahn, MD         Objective:   BP 119/71   Pulse 74   Temp 98.2 F (36.8 C)   Ht 5' 3.5" (1.613 m)   Wt 241 lb (109.3 kg)   SpO2 97%   BMI 42.02 kg/m   Wt Readings from Last 3 Encounters:  01/12/22 241 lb (109.3 kg)  11/24/21 239 lb (108.4 kg)  09/17/21 247 lb (112 kg)    Physical Exam Vitals and nursing note reviewed.  Constitutional:      General: She is not in acute distress.    Appearance: She is well-developed. She is not diaphoretic.  Eyes:     Conjunctiva/sclera: Conjunctivae normal.  Cardiovascular:     Rate and Rhythm: Normal rate and regular rhythm.     Heart sounds: Normal heart sounds. No murmur heard. Pulmonary:     Effort: Pulmonary effort is normal. No respiratory distress.     Breath sounds: Normal breath sounds. No wheezing.  Skin:    General: Skin is warm and dry.     Findings: No rash.  Neurological:     Mental Status: She is alert and oriented to person, place, and time.     Coordination: Coordination normal.  Psychiatric:        Mood and Affect: Mood is anxious and depressed.        Behavior: Behavior normal.        Thought Content: Thought content does  not include suicidal ideation. Thought content does not include suicidal plan.       Assessment & Plan:   Problem List Items Addressed This Visit       Respiratory   Asthma, moderate persistent   Relevant Medications   albuterol (VENTOLIN HFA) 108 (90 Base) MCG/ACT inhaler  Other   Depression with anxiety   Relevant Medications   sertraline (ZOLOFT) 100 MG tablet   Other Relevant Orders   TSH   Bipolar 2 disorder (HCC)   Relevant Medications   sertraline (ZOLOFT) 100 MG tablet   Other Relevant Orders   TSH   Other Visit Diagnoses     Need for HPV vaccine    -  Primary   Relevant Orders   HPV 9-valent vaccine,Recombinat       Has been allergies doing better, depression and anxiety is not, she stopped taking Zyprexa because it was too sedating but since the Zoloft by itself consistently at 50 mg.  We will increase to 100 mg try Abilify instead of Zyprexa in the future platelets and Zoloft does worse first. Follow up plan: Return if symptoms worsen or fail to improve, for 4 to 5-week anxiety depression follow-up.  Counseling provided for all of the vaccine components Orders Placed This Encounter  Procedures   HPV 9-valent vaccine,Recombinat   TSH    Arville Care, MD [facility] 01/12/2022, 8:58 AM

## 2022-01-12 NOTE — Telephone Encounter (Signed)
levalbuterol (XOPENEX HFA) 45 MCG/ACT inhaler       Changed from: albuterol (VENTOLIN HFA) 108 (90 Base) MCG/ACT inhaler   Pharmacy comment: Alternative Requested:NEEDS PA OR ALERNATE.  If no change and would like to do PA, please send as to PA pool, or note in reply & I will forward to pool

## 2022-01-26 ENCOUNTER — Ambulatory Visit: Payer: 59 | Admitting: Allergy & Immunology

## 2022-01-26 DIAGNOSIS — J309 Allergic rhinitis, unspecified: Secondary | ICD-10-CM

## 2022-02-02 ENCOUNTER — Other Ambulatory Visit: Payer: Self-pay | Admitting: Adult Health

## 2022-02-17 ENCOUNTER — Encounter: Payer: Self-pay | Admitting: Family Medicine

## 2022-02-17 ENCOUNTER — Ambulatory Visit (INDEPENDENT_AMBULATORY_CARE_PROVIDER_SITE_OTHER): Payer: 59 | Admitting: Family Medicine

## 2022-02-17 VITALS — BP 137/90 | HR 84 | Temp 98.0°F | Ht 63.5 in | Wt 242.0 lb

## 2022-02-17 DIAGNOSIS — Z23 Encounter for immunization: Secondary | ICD-10-CM | POA: Diagnosis not present

## 2022-02-17 DIAGNOSIS — F3181 Bipolar II disorder: Secondary | ICD-10-CM | POA: Diagnosis not present

## 2022-02-17 MED ORDER — CARIPRAZINE HCL 1.5 MG PO CAPS: 1.5000 mg | ORAL_CAPSULE | Freq: Every day | ORAL | 1 refills | Status: DC

## 2022-02-17 NOTE — Progress Notes (Signed)
BP (!) 137/90   Pulse 84   Temp 98 F (36.7 C)   Ht 5' 3.5" (1.613 m)   Wt 242 lb (109.8 kg)   SpO2 96%   BMI 42.20 kg/m    Subjective:   Patient ID: Nichole Guerra, female    DOB: [birth year], 26 y.o.   MRN: [id number]  HPI: Nichole Guerra is a 26 y.o. female presenting on 02/17/2022 for Medical Management of Chronic Issues, Anxiety, Depression (4-5 week follow up), and Back Pain (? Kidney stone)   HPI Anxiety depression recheck Patient is currently taking Zoloft for her anxiety and depression.  She says her anxiety and depression is doing much better but then when asked she says she has had no rest over the past couple days and feels like her energy is high and she is not sleeping well and has lots of energy and she does feel like she is starting to head towards a manic episode.  She has had these before.  She denies any suicidal ideations or thoughts of hurting self.  She does still have some crying episodes and some anxiety episodes about once a week.    02/17/2022    1:49 PM 01/12/2022    8:24 AM 09/17/2021    8:28 AM 09/17/2021    8:27 AM 02/11/2021    5:07 PM  Depression screen PHQ 2/9  Decreased Interest 2 2  1 3   Down, Depressed, Hopeless 1 2  0 2  PHQ - 2 Score 3 4  1 5   Altered sleeping 3 2 1  2   Tired, decreased energy 1 2 1  1   Change in appetite 1 1 0  1  Feeling bad or failure about yourself  0 0 0  1  Trouble concentrating 0 1 0  3  Moving slowly or fidgety/restless 0 2 0  2  Suicidal thoughts 0 0 0  0  PHQ-9 Score 8 12   15   Difficult doing work/chores Somewhat difficult Somewhat difficult Somewhat difficult       Relevant past medical, surgical, family and social history reviewed and updated as indicated. Interim medical history since our last visit reviewed. Allergies and medications reviewed and updated.  Review of Systems  Constitutional:  Negative for chills and fever.  Eyes:  Negative for visual disturbance.  Respiratory:  Negative for chest tightness and  shortness of breath.   Cardiovascular:  Negative for chest pain and leg swelling.  Musculoskeletal:  Negative for back pain and gait problem.  Skin:  Negative for rash.  Neurological:  Negative for dizziness, light-headedness and headaches.  Psychiatric/Behavioral:  Positive for decreased concentration, dysphoric mood and sleep disturbance. Negative for agitation and behavioral problems. The patient is nervous/anxious.   All other systems reviewed and are negative.   Per HPI unless specifically indicated above   Allergies as of 02/17/2022       Reactions   Penicillins Anaphylaxis   Throat swells up.   Shellfish-derived Products Anaphylaxis   Bactrim [sulfamethoxazole-trimethoprim] Hives   Hydrocodone Hives   Yellow Dyes (non-tartrazine) Rash        Medication List        Accurate as of February 17, 2022  2:12 PM. If you have any questions, ask your nurse or doctor.          STOP taking these medications    hydrOXYzine 10 MG tablet Commonly known as: ATARAX Stopped by: Zasha Belleau, MD   OLANZapine 5 MG tablet Commonly known  as: ZYPREXA Stopped by: Worthy Rancher, MD       TAKE these medications    albuterol 108 (90 Base) MCG/ACT inhaler Commonly known as: VENTOLIN HFA Inhale 2 puffs into the lungs every 6 (six) hours as needed for wheezing or shortness of breath.   cariprazine 1.5 MG capsule Commonly known as: Vraylar Take 1 capsule (1.5 mg total) by mouth daily. Started by: Fransisca Kaufmann Demetrios Byron, MD   EPINEPHrine 0.3 mg/0.3 mL Soaj injection Commonly known as: EPI-PEN Inject 0.3 mLs (0.3 mg total) into the muscle as needed for anaphylaxis.   EpiPen 2-Pak 0.3 mg/0.3 mL Soaj injection Generic drug: EPINEPHrine Inject 0.3 mg into the muscle as needed for anaphylaxis.   fexofenadine 180 MG tablet Commonly known as: ALLEGRA Take 1 tablet (180 mg total) by mouth 2 (two) times daily as needed for allergies or rhinitis.   fluticasone  furoate-vilanterol 100-25 MCG/ACT Aepb Commonly known as: BREO ELLIPTA Inhale 1 puff into the lungs daily.   Junel FE 1/20 1-20 MG-MCG tablet Generic drug: norethindrone-ethinyl estradiol-FE TAKE 1 TABLET BY MOUTH EVERY DAY   montelukast 10 MG tablet Commonly known as: SINGULAIR Take 1 tablet (10 mg total) by mouth at bedtime.   [name] 629-52 MCG/ACT Susp Generic drug: Olopatadine-Mometasone Place 2 sprays into the nose 2 (two) times daily as needed.   sertraline 100 MG tablet Commonly known as: ZOLOFT Take 1 tablet (100 mg total) by mouth daily.         Objective:   BP (!) 137/90   Pulse 84   Temp 98 F (36.7 C)   Ht 5' 3.5" (1.613 m)   Wt 242 lb (109.8 kg)   SpO2 96%   BMI 42.20 kg/m   Wt Readings from Last 3 Encounters:  02/17/22 242 lb (109.8 kg)  01/12/22 241 lb (109.3 kg)  11/24/21 239 lb (108.4 kg)    Physical Exam Vitals and nursing note reviewed.  Constitutional:      General: She is not in acute distress.    Appearance: She is well-developed. She is not diaphoretic.  Eyes:     Conjunctiva/sclera: Conjunctivae normal.  Skin:    General: Skin is warm and dry.     Findings: No rash.  Neurological:     Mental Status: She is alert and oriented to person, place, and time.     Coordination: Coordination normal.  Psychiatric:        Mood and Affect: Mood is anxious and depressed.        Speech: Speech is not rapid and pressured, slurred or tangential.        Behavior: Behavior normal. Behavior is not hyperactive or combative.        Thought Content: Thought content does not include suicidal ideation. Thought content does not include suicidal plan.       Assessment & Plan:   Problem List Items Addressed This Visit       Other   Bipolar 2 disorder ([place]) - Primary   Relevant Medications   cariprazine (VRAYLAR) 1.5 MG capsule   Other Visit Diagnoses     Need for HPV vaccination       Relevant Orders   HPV 9-valent vaccine,Recombinat  (Completed)   Need for immunization against influenza       Relevant Orders   Flu Vaccine QUAD 74mo+IM (Fluarix, Fluzone & Alfiuria Quad PF) (Completed)       We will add Vraylar, continue Zoloft and see how they do together. Follow  up plan: No follow-ups on file.  Counseling provided for all of the vaccine components Orders Placed This Encounter  Procedures   HPV 9-valent vaccine,Recombinat   Flu Vaccine QUAD 30mo+IM (Fluarix, Fluzone & Alfiuria Quad PF)    Arville Care, MD [facility] 02/17/2022, 2:12 PM

## 2022-03-31 ENCOUNTER — Ambulatory Visit: Payer: 59 | Admitting: Family Medicine

## 2022-04-01 ENCOUNTER — Encounter: Payer: Self-pay | Admitting: Family Medicine

## 2022-04-01 ENCOUNTER — Ambulatory Visit (INDEPENDENT_AMBULATORY_CARE_PROVIDER_SITE_OTHER): Payer: 59 | Admitting: Family Medicine

## 2022-04-01 VITALS — BP 120/85 | HR 74 | Temp 98.3°F | Ht 63.5 in | Wt 244.0 lb

## 2022-04-01 DIAGNOSIS — F410 Panic disorder [episodic paroxysmal anxiety] without agoraphobia: Secondary | ICD-10-CM

## 2022-04-01 DIAGNOSIS — G43809 Other migraine, not intractable, without status migrainosus: Secondary | ICD-10-CM

## 2022-04-01 DIAGNOSIS — F418 Other specified anxiety disorders: Secondary | ICD-10-CM | POA: Diagnosis not present

## 2022-04-01 DIAGNOSIS — F3181 Bipolar II disorder: Secondary | ICD-10-CM

## 2022-04-01 MED ORDER — RIZATRIPTAN BENZOATE 10 MG PO TABS: 10.0000 mg | ORAL_TABLET | ORAL | 0 refills | Status: DC | PRN

## 2022-04-01 NOTE — Progress Notes (Signed)
BP 120/85   Pulse 74   Temp 98.3 F (36.8 C)   Ht 5' 3.5" (1.613 m)   Wt 244 lb (110.7 kg)   SpO2 97%   BMI 42.54 kg/m    Subjective:   Patient ID: Nichole Guerra, female    DOB: [birth year], 26 y.o.   MRN: [id number]  HPI: Nichole Guerra is a 26 y.o. female presenting on 04/01/2022 for Medical Management of Chronic Issues, Anxiety, and Depression (Started Vraylar last visit and is feeling a lot better)   HPI Depression and bipolar and anxiety and panic disorder recheck Patient started Vraylar the last time she was here and is still taking the Zoloft.  She is coming in for recheck today.  She says she is feeling a lot better on the medicine and it is helping a lot and the only issue that she still having a little bit is sleep but she is not having anxiety episodes or panic.  Overall she is very happy with the medicine.  She denies any side effects.  She denies any suicidal ideations.    04/01/2022    9:07 AM 02/17/2022    1:49 PM 01/12/2022    8:24 AM 09/17/2021    8:28 AM 09/17/2021    8:27 AM  Depression screen PHQ 2/9  Decreased Interest 0 2 2  1   Down, Depressed, Hopeless 0 1 2  0  PHQ - 2 Score 0 3 4  1   Altered sleeping 2 3 2 1    Tired, decreased energy 1 1 2 1    Change in appetite 0 1 1 0   Feeling bad or failure about yourself  0 0 0 0   Trouble concentrating 0 0 1 0   Moving slowly or fidgety/restless 0 0 2 0   Suicidal thoughts 0 0 0 0   PHQ-9 Score 3 8 12     Difficult doing work/chores Somewhat difficult Somewhat difficult Somewhat difficult Somewhat difficult      Relevant past medical, surgical, family and social history reviewed and updated as indicated. Interim medical history since our last visit reviewed. Allergies and medications reviewed and updated.  Review of Systems  Constitutional:  Negative for chills and fever.  Eyes:  Negative for visual disturbance.  Respiratory:  Negative for chest tightness and shortness of breath.   Cardiovascular:  Negative for  chest pain and leg swelling.  Musculoskeletal:  Negative for back pain and gait problem.  Skin:  Negative for rash.  Neurological:  Negative for light-headedness and headaches.  Psychiatric/Behavioral:  Positive for dysphoric mood. Negative for agitation, behavioral problems, self-injury, sleep disturbance and suicidal ideas. The patient is nervous/anxious.   All other systems reviewed and are negative.   Per HPI unless specifically indicated above   Allergies as of 04/01/2022       Reactions   Penicillins Anaphylaxis   Throat swells up.   Shellfish-derived Products Anaphylaxis   Bactrim [sulfamethoxazole-trimethoprim] Hives   Hydrocodone Hives   Yellow Dyes (non-tartrazine) Rash        Medication List        Accurate as of April 01, 2022  9:13 AM. If you have any questions, ask your nurse or doctor.          albuterol 108 (90 Base) MCG/ACT inhaler Commonly known as: VENTOLIN HFA Inhale 2 puffs into the lungs every 6 (six) hours as needed for wheezing or shortness of breath.   cariprazine 1.5 MG capsule Commonly known as: Vraylar Take 1  capsule (1.5 mg total) by mouth daily.   EpiPen 2-Pak 0.3 mg/0.3 mL Soaj injection Generic drug: EPINEPHrine Inject 0.3 mg into the muscle as needed for anaphylaxis. What changed: Another medication with the same name was removed. Continue taking this medication, and follow the directions you see here. Changed by: Fransisca Kaufmann Liberty Seto, MD   fexofenadine 180 MG tablet Commonly known as: ALLEGRA Take 1 tablet (180 mg total) by mouth 2 (two) times daily as needed for allergies or rhinitis.   fluticasone furoate-vilanterol 100-25 MCG/ACT Aepb Commonly known as: BREO ELLIPTA Inhale 1 puff into the lungs daily.   Junel FE 1/20 1-20 MG-MCG tablet Generic drug: norethindrone-ethinyl estradiol-FE TAKE 1 TABLET BY MOUTH EVERY DAY   montelukast 10 MG tablet Commonly known as: SINGULAIR Take 1 tablet (10 mg total) by mouth at  bedtime.   [name] [id number] MCG/ACT Susp Generic drug: Olopatadine-Mometasone Place 2 sprays into the nose 2 (two) times daily as needed.   sertraline 100 MG tablet Commonly known as: ZOLOFT Take 1 tablet (100 mg total) by mouth daily.         Objective:   BP 120/85   Pulse 74   Temp 98.3 F (36.8 C)   Ht 5' 3.5" (1.613 m)   Wt 244 lb (110.7 kg)   SpO2 97%   BMI 42.54 kg/m   Wt Readings from Last 3 Encounters:  04/01/22 244 lb (110.7 kg)  02/17/22 242 lb (109.8 kg)  01/12/22 241 lb (109.3 kg)    Physical Exam Vitals and nursing note reviewed.  Constitutional:      General: She is not in acute distress.    Appearance: She is well-developed. She is not diaphoretic.  Eyes:     Conjunctiva/sclera: Conjunctivae normal.  Cardiovascular:     Rate and Rhythm: Normal rate and regular rhythm.     Heart sounds: Normal heart sounds. No murmur heard. Pulmonary:     Effort: Pulmonary effort is normal. No respiratory distress.     Breath sounds: Normal breath sounds. No wheezing.  Musculoskeletal:        General: No tenderness. Normal range of motion.  Skin:    General: Skin is warm and dry.     Findings: No rash.  Neurological:     Mental Status: She is alert and oriented to person, place, and time.     Coordination: Coordination normal.  Psychiatric:        Mood and Affect: Mood is anxious and depressed.        Behavior: Behavior normal.       Assessment & Plan:   Problem List Items Addressed This Visit       Other   Depression with anxiety - Primary   Bipolar 2 disorder ([place])   Panic disorder    Continue current medicine, no changes.  Did give a sample of Vraylar because she says she may be transitioning between insurances soon and will have new insurance starting in January. Follow up plan: Return in about 3 months (around 07/02/2022), or if symptoms worsen or fail to improve, for Depression and anxiety recheck.  Counseling provided for all of the vaccine  components No orders of the defined types were placed in this encounter.   Caryl Pina, MD [facility] 04/01/2022, 9:13 AM

## 2022-04-07 ENCOUNTER — Encounter: Payer: Self-pay | Admitting: Family Medicine

## 2022-04-12 NOTE — Telephone Encounter (Signed)
Medication not a covered benefit. Left message to return call.

## 2022-04-12 NOTE — Telephone Encounter (Signed)
Left message for pt to return call.  Is she able to use albuterol? If so, can send.

## 2022-04-13 ENCOUNTER — Ambulatory Visit (INDEPENDENT_AMBULATORY_CARE_PROVIDER_SITE_OTHER): Payer: 59 | Admitting: Family Medicine

## 2022-04-13 ENCOUNTER — Encounter: Payer: Self-pay | Admitting: Family Medicine

## 2022-04-13 DIAGNOSIS — G43809 Other migraine, not intractable, without status migrainosus: Secondary | ICD-10-CM

## 2022-04-13 MED ORDER — ONDANSETRON 4 MG PO TBDP: 4.0000 mg | ORAL_TABLET | Freq: Three times a day (TID) | ORAL | 0 refills | Status: DC | PRN

## 2022-04-13 MED ORDER — RIZATRIPTAN BENZOATE 10 MG PO TABS: 10.0000 mg | ORAL_TABLET | ORAL | 0 refills | Status: DC | PRN

## 2022-04-13 NOTE — Progress Notes (Signed)
Virtual Visit via telephone Note  I connected with Nichole Guerra on 04/13/22 at 1342 by telephone and verified that I am speaking with the correct person using two identifiers. Nichole Guerra is currently located at home and patient are currently with her during visit. The provider, Elige Radon Nakeya Adinolfi, MD is located in their office at time of visit.  Call ended at 1355  I discussed the limitations, risks, security and privacy concerns of performing an evaluation and management service by telephone and the availability of in person appointments. I also discussed with the patient that there may be a patient responsible charge related to this service. The patient expressed understanding and agreed to proceed.   History and Present Illness: Patient is calling in for headaches and migraines. She gets headaches 1-20 minutes after eating.  She has nausea and vomiting.  She sees aura.  She takes tylenol or ibuprofen and it helps. Sometimes last a few hours and sometimes all day.  2-3 times since November 18th about 3 weeks. She denies any change at home.  She does have a new job at [employer] in [place]. She is sleeping good amount of hours. She has anxiety but it has not been worse. She is having headaches in the front of her head.  She denies sinus pressure or congestion. She since headaches she did reduce salt. She denies alcohol or smoking.  She did decrease her coffee intake because of her new job. Her menses are still controlled with OCPs.  1. Other migraine without status migrainosus, not intractable     Outpatient Encounter Medications as of 04/13/2022  Medication Sig   ondansetron (ZOFRAN-ODT) 4 MG disintegrating tablet Take 1 tablet (4 mg total) by mouth every 8 (eight) hours as needed for nausea or vomiting.   albuterol (VENTOLIN HFA) 108 (90 Base) MCG/ACT inhaler Inhale 2 puffs into the lungs every 6 (six) hours as needed for wheezing or shortness of breath.   cariprazine (VRAYLAR) 1.5 MG  capsule Take 1 capsule (1.5 mg total) by mouth daily.   EPIPEN 2-PAK 0.3 MG/0.3ML SOAJ injection Inject 0.3 mg into the muscle as needed for anaphylaxis.   fexofenadine (ALLEGRA) 180 MG tablet Take 1 tablet (180 mg total) by mouth 2 (two) times daily as needed for allergies or rhinitis.   fluticasone furoate-vilanterol (BREO ELLIPTA) 100-25 MCG/ACT AEPB Inhale 1 puff into the lungs daily.   JUNEL FE 1/20 1-20 MG-MCG tablet TAKE 1 TABLET BY MOUTH EVERY DAY   montelukast (SINGULAIR) 10 MG tablet Take 1 tablet (10 mg total) by mouth at bedtime.   Olopatadine-Mometasone ([name]) [id number] MCG/ACT SUSP Place 2 sprays into the nose 2 (two) times daily as needed.   rizatriptan (MAXALT) 10 MG tablet Take 1 tablet (10 mg total) by mouth as needed for migraine. May repeat in 2 hours if needed   sertraline (ZOLOFT) 100 MG tablet Take 1 tablet (100 mg total) by mouth daily.   [DISCONTINUED] rizatriptan (MAXALT) 10 MG tablet Take 1 tablet (10 mg total) by mouth as needed for migraine. May repeat in 2 hours if needed   No facility-administered encounter medications on file as of 04/13/2022.    Review of Systems  Constitutional:  Negative for chills and fever.  HENT:  Negative for congestion, ear discharge, ear pain, sinus pressure, sinus pain, sneezing and sore throat.   Eyes:  Positive for photophobia. Negative for discharge and visual disturbance.  Respiratory:  Negative for chest tightness and shortness of breath.   Cardiovascular:  Negative for chest pain and leg swelling.  Gastrointestinal:  Positive for nausea and vomiting.  Genitourinary:  Negative for difficulty urinating and dysuria.  Skin:  Negative for rash.  Neurological:  Positive for headaches. Negative for dizziness and light-headedness.  Psychiatric/Behavioral:  Negative for agitation and behavioral problems.   All other systems reviewed and are negative.   Observations/Objective: Patient sounds comfortable and in no acute  distress  Assessment and Plan: Problem List Items Addressed This Visit   None Visit Diagnoses     Other migraine without status migrainosus, not intractable       Relevant Medications   rizatriptan (MAXALT) 10 MG tablet   ondansetron (ZOFRAN-ODT) 4 MG disintegrating tablet       Patient does not have insurance currently and wants to stick with cheap options and for now it is only going on for the past few weeks so we will try Maxalt for migraine abortive and Zofran for nausea and she is going to keep a headache journal Follow up plan: Return if symptoms worsen or fail to improve.     I discussed the assessment and treatment plan with the patient. The patient was provided an opportunity to ask questions and all were answered. The patient agreed with the plan and demonstrated an understanding of the instructions.   The patient was advised to call back or seek an in-person evaluation if the symptoms worsen or if the condition fails to improve as anticipated.  The above assessment and management plan was discussed with the patient. The patient verbalized understanding of and has agreed to the management plan. Patient is aware to call the clinic if symptoms persist or worsen. Patient is aware when to return to the clinic for a follow-up visit. Patient educated on when it is appropriate to go to the emergency department.    I provided 13 minutes of non-face-to-face time during this encounter.    Nils Pyle, MD

## 2022-05-03 ENCOUNTER — Encounter: Payer: Self-pay | Admitting: Family Medicine

## 2022-05-03 ENCOUNTER — Other Ambulatory Visit: Payer: Self-pay | Admitting: Family Medicine

## 2022-05-03 DIAGNOSIS — F3181 Bipolar II disorder: Secondary | ICD-10-CM

## 2022-05-16 ENCOUNTER — Encounter: Payer: Self-pay | Admitting: Family Medicine

## 2022-06-01 ENCOUNTER — Other Ambulatory Visit: Payer: Self-pay | Admitting: Family Medicine

## 2022-06-01 DIAGNOSIS — J4540 Moderate persistent asthma, uncomplicated: Secondary | ICD-10-CM

## 2022-06-01 DIAGNOSIS — Z9109 Other allergy status, other than to drugs and biological substances: Secondary | ICD-10-CM

## 2022-06-08 ENCOUNTER — Other Ambulatory Visit: Payer: Self-pay | Admitting: Allergy & Immunology

## 2022-06-24 ENCOUNTER — Other Ambulatory Visit: Payer: Self-pay | Admitting: *Deleted

## 2022-06-24 MED ORDER — RYALTRIS 665-25 MCG/ACT NA SUSP: 2.0000 | Freq: Two times a day (BID) | NASAL | 0 refills | Status: DC | PRN

## 2022-07-05 ENCOUNTER — Encounter: Payer: Self-pay | Admitting: Family Medicine

## 2022-07-07 ENCOUNTER — Ambulatory Visit: Payer: 59 | Admitting: Family Medicine

## 2022-07-22 ENCOUNTER — Ambulatory Visit (INDEPENDENT_AMBULATORY_CARE_PROVIDER_SITE_OTHER): Payer: Self-pay | Admitting: Family Medicine

## 2022-07-22 ENCOUNTER — Encounter: Payer: Self-pay | Admitting: Family Medicine

## 2022-07-22 VITALS — BP 125/86 | HR 86 | Ht 63.5 in | Wt 255.0 lb

## 2022-07-22 DIAGNOSIS — F418 Other specified anxiety disorders: Secondary | ICD-10-CM

## 2022-07-22 DIAGNOSIS — F3181 Bipolar II disorder: Secondary | ICD-10-CM

## 2022-07-22 NOTE — Progress Notes (Signed)
BP 125/86   Pulse 86   Ht 5' 3.5" (1.613 m)   Wt 255 lb (115.7 kg)   SpO2 96%   BMI 44.46 kg/m    Subjective:   Patient ID: Nichole Guerra, female    DOB: [birth year], 27 y.o.   MRN: [id number]  HPI: Nichole Guerra is a 27 y.o. female presenting on 07/22/2022 for Medical Management of Chronic Issues, Anxiety, and Depression   HPI Depression and anxiety and bipolar 2 recheck Patient is coming in with depression and anxiety and bipolar for recheck.  She is still currently taking the Vraylar 1.5.  She says it is not enough at this point but she does not have insurance we do not have any extra samples to try and increase the dose.  She denies any suicidal ideations or thoughts of hurting herself.  She does also feel better because she is in a better work position which is helping with her anxiety as well.  She is still taking sertraline as well.    07/22/2022   11:24 AM 04/01/2022    9:07 AM 02/17/2022    1:49 PM 01/12/2022    8:24 AM 09/17/2021    8:28 AM  Depression screen PHQ 2/9  Decreased Interest 2 0 2 2   Down, Depressed, Hopeless 1 0 1 2   PHQ - 2 Score 3 0 3 4   Altered sleeping '3 2 3 2 1  '$ Tired, decreased energy '1 1 1 2 1  '$ Change in appetite 1 0 1 1 0  Feeling bad or failure about yourself  1 0 0 0 0  Trouble concentrating 1 0 0 1 0  Moving slowly or fidgety/restless 1 0 0 2 0  Suicidal thoughts 0 0 0 0 0  PHQ-9 Score '11 3 8 12   '$ Difficult doing work/chores Somewhat difficult Somewhat difficult Somewhat difficult Somewhat difficult Somewhat difficult     Relevant past medical, surgical, family and social history reviewed and updated as indicated. Interim medical history since our last visit reviewed. Allergies and medications reviewed and updated.  Review of Systems  Constitutional:  Negative for chills and fever.  Eyes:  Negative for visual disturbance.  Respiratory:  Negative for chest tightness and shortness of breath.   Cardiovascular:  Negative for chest pain and leg  swelling.  Skin:  Negative for rash.  Neurological:  Negative for dizziness, light-headedness and headaches.  Psychiatric/Behavioral:  Positive for dysphoric mood. Negative for agitation, behavioral problems, self-injury, sleep disturbance and suicidal ideas. The patient is nervous/anxious.   All other systems reviewed and are negative.   Per HPI unless specifically indicated above   Allergies as of 07/22/2022       Reactions   Penicillins Anaphylaxis   Throat swells up.   Shellfish-derived Products Anaphylaxis   Bactrim [sulfamethoxazole-trimethoprim] Hives   Hydrocodone Hives   Yellow Dyes (non-tartrazine) Rash        Medication List        Accurate as of July 22, 2022 12:42 PM. If you have any questions, ask your nurse or doctor.          albuterol 108 (90 Base) MCG/ACT inhaler Commonly known as: VENTOLIN HFA Inhale 2 puffs into the lungs every 6 (six) hours as needed for wheezing or shortness of breath.   EpiPen 2-Pak 0.3 mg/0.3 mL Soaj injection Generic drug: EPINEPHrine Inject 0.3 mg into the muscle as needed for anaphylaxis.   fexofenadine 180 MG tablet Commonly known as: ALLEGRA Take 1  tablet (180 mg total) by mouth 2 (two) times daily as needed for allergies or rhinitis.   fluticasone furoate-vilanterol 100-25 MCG/ACT Aepb Commonly known as: BREO ELLIPTA Inhale 1 puff into the lungs daily.   Junel FE 1/20 1-20 MG-MCG tablet Generic drug: norethindrone-ethinyl estradiol-FE TAKE 1 TABLET BY MOUTH EVERY DAY   montelukast 10 MG tablet Commonly known as: SINGULAIR TAKE 1 TABLET BY MOUTH EVERYDAY AT BEDTIME   ondansetron 4 MG disintegrating tablet Commonly known as: ZOFRAN-ODT Take 1 tablet (4 mg total) by mouth every 8 (eight) hours as needed for nausea or vomiting.   rizatriptan 10 MG tablet Commonly known as: Maxalt Take 1 tablet (10 mg total) by mouth as needed for migraine. May repeat in 2 hours if needed   [name] 665-25 MCG/ACT Susp Generic  drug: Olopatadine-Mometasone Place 2 sprays into the nose 2 (two) times daily as needed.   sertraline 100 MG tablet Commonly known as: ZOLOFT Take 1 tablet (100 mg total) by mouth daily.   Vraylar 1.5 MG capsule Generic drug: cariprazine TAKE 1 CAPSULE BY MOUTH DAILY.         Objective:   BP 125/86   Pulse 86   Ht 5' 3.5" (1.613 m)   Wt 255 lb (115.7 kg)   SpO2 96%   BMI 44.46 kg/m   Wt Readings from Last 3 Encounters:  07/22/22 255 lb (115.7 kg)  04/01/22 244 lb (110.7 kg)  02/17/22 242 lb (109.8 kg)    Physical Exam Vitals and nursing note reviewed.  Constitutional:      General: She is not in acute distress.    Appearance: She is well-developed. She is not diaphoretic.  Eyes:     Conjunctiva/sclera: Conjunctivae normal.  Cardiovascular:     Rate and Rhythm: Normal rate and regular rhythm.     Heart sounds: Normal heart sounds. No murmur heard. Pulmonary:     Effort: Pulmonary effort is normal. No respiratory distress.     Breath sounds: Normal breath sounds. No wheezing.  Musculoskeletal:        General: No tenderness. Normal range of motion.  Skin:    General: Skin is warm and dry.     Findings: No rash.  Neurological:     Mental Status: She is alert and oriented to person, place, and time.     Coordination: Coordination normal.  Psychiatric:        Behavior: Behavior normal.       Assessment & Plan:   Problem List Items Addressed This Visit       Other   Depression with anxiety   Bipolar 2 disorder ([place]) - Primary    Continue current medicines for now, want to increase vraylar when she gets insurance Follow up plan: Return if symptoms worsen or fail to improve.  Counseling provided for all of the vaccine components No orders of the defined types were placed in this encounter.   Caryl Pina, MD [facility] 07/22/2022, 12:42 PM

## 2022-07-29 ENCOUNTER — Encounter: Payer: Self-pay | Admitting: Family Medicine

## 2022-08-01 ENCOUNTER — Telehealth: Payer: Self-pay | Admitting: Family Medicine

## 2022-08-01 ENCOUNTER — Telehealth (INDEPENDENT_AMBULATORY_CARE_PROVIDER_SITE_OTHER): Payer: Self-pay | Admitting: Family Medicine

## 2022-08-01 ENCOUNTER — Encounter: Payer: Self-pay | Admitting: Family Medicine

## 2022-08-01 DIAGNOSIS — A084 Viral intestinal infection, unspecified: Secondary | ICD-10-CM

## 2022-08-01 MED ORDER — FAMOTIDINE 20 MG PO TABS: 20.0000 mg | ORAL_TABLET | Freq: Two times a day (BID) | ORAL | 1 refills | Status: DC

## 2022-08-01 NOTE — Telephone Encounter (Signed)
Corrected not sent to patient mychart she is aware and verbalized understanding.

## 2022-08-01 NOTE — Progress Notes (Signed)
Virtual Visit via MyChart video note  I connected with Nichole Guerra on 08/01/22 at 1409 by video and verified that I am speaking with the correct person using two identifiers. Nichole Guerra is currently located at home and patient are currently with her during visit. The provider, Fransisca Kaufmann Giovani Neumeister, MD is located in their office at time of visit.  Call ended at 1414  I discussed the limitations, risks, security and privacy concerns of performing an evaluation and management service by video and the availability of in person appointments. I also discussed with the patient that there may be a patient responsible charge related to this service. The patient expressed understanding and agreed to proceed.   History and Present Illness: Patient is calling in for diarrhea and abdominal pains after eating.  She has this every time she eats. She has this for the past 5 days. She has some nausea but no vomiting. She is getting a lot heartburn and reflux.  She has tried tums and it has helped some. She has some illness and stomach viruses at work.   1. Viral gastroenteritis     Outpatient Encounter Medications as of 08/01/2022  Medication Sig   famotidine (PEPCID) 20 MG tablet Take 1 tablet (20 mg total) by mouth 2 (two) times daily.   albuterol (VENTOLIN HFA) 108 (90 Base) MCG/ACT inhaler Inhale 2 puffs into the lungs every 6 (six) hours as needed for wheezing or shortness of breath.   EPIPEN 2-PAK 0.3 MG/0.3ML SOAJ injection Inject 0.3 mg into the muscle as needed for anaphylaxis.   fexofenadine (ALLEGRA) 180 MG tablet Take 1 tablet (180 mg total) by mouth 2 (two) times daily as needed for allergies or rhinitis.   fluticasone furoate-vilanterol (BREO ELLIPTA) 100-25 MCG/ACT AEPB Inhale 1 puff into the lungs daily.   JUNEL FE 1/20 1-20 MG-MCG tablet TAKE 1 TABLET BY MOUTH EVERY DAY   montelukast (SINGULAIR) 10 MG tablet TAKE 1 TABLET BY MOUTH EVERYDAY AT BEDTIME   Olopatadine-Mometasone ([name])  665-25 MCG/ACT SUSP Place 2 sprays into the nose 2 (two) times daily as needed.   ondansetron (ZOFRAN-ODT) 4 MG disintegrating tablet Take 1 tablet (4 mg total) by mouth every 8 (eight) hours as needed for nausea or vomiting.   rizatriptan (MAXALT) 10 MG tablet Take 1 tablet (10 mg total) by mouth as needed for migraine. May repeat in 2 hours if needed   sertraline (ZOLOFT) 100 MG tablet Take 1 tablet (100 mg total) by mouth daily.   VRAYLAR 1.5 MG capsule TAKE 1 CAPSULE BY MOUTH DAILY.   No facility-administered encounter medications on file as of 08/01/2022.    Review of Systems  Constitutional:  Negative for chills and fever.  Eyes:  Negative for visual disturbance.  Respiratory:  Negative for chest tightness and shortness of breath.   Cardiovascular:  Negative for chest pain and leg swelling.  Gastrointestinal:  Positive for abdominal pain, diarrhea and nausea. Negative for constipation and vomiting.  Skin:  Negative for rash.  Neurological:  Negative for light-headedness and headaches.  Psychiatric/Behavioral:  Negative for agitation and behavioral problems.   All other systems reviewed and are negative.   Observations/Objective: Patient sounds and looks comfortable and in no acute distress  Assessment and Plan: Problem List Items Addressed This Visit   None Visit Diagnoses     Viral gastroenteritis    -  Primary   Relevant Medications   famotidine (PEPCID) 20 MG tablet       Will give  famotidine and recommend for at least 2 weeks take it twice a day.  Likely has some acid or acid reflux after a viral infection. Follow up plan: Return if symptoms worsen or fail to improve.     I discussed the assessment and treatment plan with the patient. The patient was provided an opportunity to ask questions and all were answered. The patient agreed with the plan and demonstrated an understanding of the instructions.   The patient was advised to call back or seek an in-person  evaluation if the symptoms worsen or if the condition fails to improve as anticipated.  The above assessment and management plan was discussed with the patient. The patient verbalized understanding of and has agreed to the management plan. Patient is aware to call the clinic if symptoms persist or worsen. Patient is aware when to return to the clinic for a follow-up visit. Patient educated on when it is appropriate to go to the emergency department.    I provided 5 minutes of non-face-to-face time during this encounter.    Worthy Rancher, MD

## 2022-08-12 ENCOUNTER — Encounter: Payer: Self-pay | Admitting: Family Medicine

## 2022-08-14 ENCOUNTER — Other Ambulatory Visit: Payer: Self-pay | Admitting: Allergy & Immunology

## 2022-08-15 ENCOUNTER — Other Ambulatory Visit: Payer: Self-pay | Admitting: Family Medicine

## 2022-08-15 DIAGNOSIS — A084 Viral intestinal infection, unspecified: Secondary | ICD-10-CM

## 2022-08-16 ENCOUNTER — Other Ambulatory Visit: Payer: Self-pay

## 2022-08-16 DIAGNOSIS — G43809 Other migraine, not intractable, without status migrainosus: Secondary | ICD-10-CM

## 2022-08-16 DIAGNOSIS — A084 Viral intestinal infection, unspecified: Secondary | ICD-10-CM

## 2022-08-16 DIAGNOSIS — F418 Other specified anxiety disorders: Secondary | ICD-10-CM

## 2022-08-16 DIAGNOSIS — F3181 Bipolar II disorder: Secondary | ICD-10-CM

## 2022-08-16 DIAGNOSIS — J4540 Moderate persistent asthma, uncomplicated: Secondary | ICD-10-CM

## 2022-08-16 MED ORDER — RIZATRIPTAN BENZOATE 10 MG PO TABS: 10.0000 mg | ORAL_TABLET | ORAL | 0 refills | Status: DC | PRN

## 2022-08-16 MED ORDER — ALBUTEROL SULFATE HFA 108 (90 BASE) MCG/ACT IN AERS: 2.0000 | INHALATION_SPRAY | Freq: Four times a day (QID) | RESPIRATORY_TRACT | 1 refills | Status: DC | PRN

## 2022-08-16 MED ORDER — CARIPRAZINE HCL 3 MG PO CAPS: 3.0000 mg | ORAL_CAPSULE | Freq: Every day | ORAL | 0 refills | Status: DC

## 2022-08-16 MED ORDER — SERTRALINE HCL 100 MG PO TABS: 100.0000 mg | ORAL_TABLET | Freq: Every day | ORAL | 1 refills | Status: DC

## 2022-08-16 MED ORDER — ONDANSETRON 4 MG PO TBDP: 4.0000 mg | ORAL_TABLET | Freq: Three times a day (TID) | ORAL | 0 refills | Status: DC | PRN

## 2022-08-16 MED ORDER — FAMOTIDINE 20 MG PO TABS
20.0000 mg | ORAL_TABLET | Freq: Two times a day (BID) | ORAL | 1 refills | Status: DC
Start: 2022-08-16 — End: 2022-11-26

## 2022-08-17 ENCOUNTER — Encounter: Payer: Self-pay | Admitting: Family Medicine

## 2022-08-17 MED ORDER — SUCRALFATE 1 G PO TABS: 1.0000 g | ORAL_TABLET | Freq: Three times a day (TID) | ORAL | 0 refills | Status: DC

## 2022-08-29 ENCOUNTER — Other Ambulatory Visit: Payer: Self-pay | Admitting: Family Medicine

## 2022-08-29 DIAGNOSIS — F418 Other specified anxiety disorders: Secondary | ICD-10-CM

## 2022-08-29 DIAGNOSIS — F3181 Bipolar II disorder: Secondary | ICD-10-CM

## 2022-08-30 ENCOUNTER — Telehealth: Payer: Medicaid Other

## 2022-08-30 NOTE — Telephone Encounter (Signed)
Unable to complete PA's in covermymeds or on [employer] provider portal.  Printed PA forms from [employer]. They have been completed and OV notes attached. Placed on Dettinger's desk for review and signature.

## 2022-08-31 NOTE — Telephone Encounter (Signed)
Dettinger has reviewed and signed forms. Forms faxed to [facility] [phone].

## 2022-09-19 ENCOUNTER — Ambulatory Visit: Payer: Self-pay | Admitting: Family Medicine

## 2022-09-20 ENCOUNTER — Other Ambulatory Visit (HOSPITAL_COMMUNITY): Payer: Self-pay

## 2022-11-04 ENCOUNTER — Telehealth: Payer: Self-pay

## 2022-11-08 ENCOUNTER — Other Ambulatory Visit: Payer: Self-pay

## 2022-11-08 ENCOUNTER — Emergency Department (HOSPITAL_COMMUNITY)
Admission: EM | Admit: 2022-11-08 | Discharge: 2022-11-09 | Disposition: A | Discharge status: Home / Self Care | Payer: Self-pay | Attending: Emergency Medicine | Admitting: Emergency Medicine

## 2022-11-08 ENCOUNTER — Encounter (HOSPITAL_COMMUNITY): Payer: Self-pay | Admitting: Emergency Medicine

## 2022-11-08 ENCOUNTER — Emergency Department (HOSPITAL_COMMUNITY): Payer: Self-pay

## 2022-11-08 DIAGNOSIS — R300 Dysuria: Secondary | ICD-10-CM | POA: Insufficient documentation

## 2022-11-08 DIAGNOSIS — R109 Unspecified abdominal pain: Secondary | ICD-10-CM | POA: Insufficient documentation

## 2022-11-08 LAB — URINALYSIS, ROUTINE W REFLEX MICROSCOPIC
Bilirubin Urine: NEGATIVE
Glucose, UA: NEGATIVE mg/dL
Hgb urine dipstick: NEGATIVE
Ketones, ur: NEGATIVE mg/dL
Leukocytes,Ua: NEGATIVE
Nitrite: POSITIVE — AB
Protein, ur: NEGATIVE mg/dL
Specific Gravity, Urine: 1.017 (ref 1.005–1.030)
pH: 6 (ref 5.0–8.0)

## 2022-11-08 LAB — PREGNANCY, URINE: Preg Test, Ur: NEGATIVE

## 2022-11-08 NOTE — ED Provider Notes (Signed)
[place] EMERGENCY DEPARTMENT AT [facility]  Provider Note  CSN: [id number] Arrival date & time: 11/08/22 2138  History Chief Complaint  Patient presents with   Urinary Tract Infection    Nichole Guerra is a 27 y.o. female reports several weeks of dysuria and L flank pain. Initially had urinary urgency, burning, and hematuria, went to [facility] on 5/31, UA shows WBC and RBC and given Rx for Cipro. Culture then showed mixed flora. She continued to have some burning and L flank pain but no hematuria. Went back to [facility] on 6/21, UA then had small WBC, given another Rx for Cipro. Repeat culture then was again mixed flora. She has not had any improvement since then. She denies any vaginal bleeding. No fever or vomiting. L flank pain is intermittent.    Home Medications Prior to Admission medications   Medication Sig Start Date End Date Taking? Authorizing Provider  cariprazine (VRAYLAR) 3 MG capsule Take 1 capsule (3 mg total) by mouth daily. 08/16/22   Dettinger, Elige Radon, MD  phenazopyridine (PYRIDIUM) 200 MG tablet Take 1 tablet (200 mg total) by mouth 3 (three) times daily. 11/09/22  Yes Pollyann Savoy, MD  albuterol (VENTOLIN HFA) 108 (90 Base) MCG/ACT inhaler Inhale 2 puffs into the lungs every 6 (six) hours as needed for wheezing or shortness of breath. 08/16/22   Dettinger, Elige Radon, MD  EPIPEN 2-PAK 0.3 MG/0.3ML SOAJ injection Inject 0.3 mg into the muscle as needed for anaphylaxis. 12/06/21   Alfonse Spruce, MD  famotidine (PEPCID) 20 MG tablet Take 1 tablet (20 mg total) by mouth 2 (two) times daily. 08/16/22   Dettinger, Elige Radon, MD  fexofenadine (ALLEGRA) 180 MG tablet Take 1 tablet (180 mg total) by mouth 2 (two) times daily as needed for allergies or rhinitis. 11/24/21   Alfonse Spruce, MD  fluticasone furoate-vilanterol (BREO ELLIPTA) 100-25 MCG/ACT AEPB Inhale 1 puff into the lungs daily. 09/17/21   Dettinger, Elige Radon, MD  JUNEL FE 1/20 1-20 MG-MCG tablet TAKE 1  TABLET BY MOUTH EVERY DAY 02/02/22   Cyril Mourning A, NP  montelukast (SINGULAIR) 10 MG tablet TAKE 1 TABLET BY MOUTH EVERYDAY AT BEDTIME 06/01/22   Dettinger, Elige Radon, MD  Olopatadine-Mometasone ([name]) [phone] MCG/ACT SUSP Place 2 sprays into the nose 2 (two) times daily as needed. 06/24/22   Alfonse Spruce, MD  ondansetron (ZOFRAN-ODT) 4 MG disintegrating tablet Take 1 tablet (4 mg total) by mouth every 8 (eight) hours as needed for nausea or vomiting. 08/16/22   Dettinger, Elige Radon, MD  rizatriptan (MAXALT) 10 MG tablet Take 1 tablet (10 mg total) by mouth as needed for migraine. May repeat in 2 hours if needed 08/16/22   Dettinger, Elige Radon, MD  sertraline (ZOLOFT) 100 MG tablet Take 1 tablet (100 mg total) by mouth daily. 08/16/22   Dettinger, Elige Radon, MD  sucralfate (CARAFATE) 1 g tablet Take 1 tablet (1 g total) by mouth 4 (four) times daily -  with meals and at bedtime. 08/17/22   Dettinger, Elige Radon, MD     Allergies    Penicillins, Shellfish-derived products, Bactrim [sulfamethoxazole-trimethoprim], Hydrocodone, and Yellow dyes (non-tartrazine)   Review of Systems   Review of Systems Please see HPI for pertinent positives and negatives  Physical Exam BP 127/83 (BP Location: Right Arm)   Pulse 84   Temp 98.5 F (36.9 C) (Oral)   Resp 16   Ht 5\' 3"  (1.6 m)   Wt 115.7 kg  SpO2 97%   BMI 45.18 kg/m   Physical Exam Vitals and nursing note reviewed.  Constitutional:      Appearance: Normal appearance.  HENT:     Head: Normocephalic and atraumatic.     Nose: Nose normal.     Mouth/Throat:     Mouth: Mucous membranes are moist.  Eyes:     Extraocular Movements: Extraocular movements intact.     Conjunctiva/sclera: Conjunctivae normal.  Cardiovascular:     Rate and Rhythm: Normal rate.  Pulmonary:     Effort: Pulmonary effort is normal.     Breath sounds: Normal breath sounds.  Abdominal:     General: Abdomen is flat.     Palpations: Abdomen is soft.      Tenderness: There is no abdominal tenderness. There is no right CVA tenderness or left CVA tenderness.  Musculoskeletal:        General: No swelling or tenderness. Normal range of motion.     Cervical back: Neck supple.  Skin:    General: Skin is warm and dry.  Neurological:     General: No focal deficit present.     Mental Status: She is alert.  Psychiatric:        Mood and Affect: Mood normal.     ED Results / Procedures / Treatments   EKG None  Procedures Procedures  Medications Ordered in the ED Medications - No data to display  Initial Impression and Plan  Patient here for persistent dysuria and L flank pain. UA here is not convincing for UTI although she has been on Cipro recently so difficult to accurately determine. Culture from OSH last week was mixed flora only. Consider viral infection, interstitial cystitis or renal colic. Will check HCG, send for CT to eval stones. If neg, plan AZO and outpatient urology follow up for further evaluation.   ED Course   Clinical Course as of 11/09/22 0027  Wed Nov 09, 2022  0025 I personally viewed the images from radiology studies and agree with radiologist interpretation:  CT is neg for stone. Will plan AZO and outpatient urology follow up. PCP follow up or RTED for any other concerns.   [CS]    Clinical Course User Index [CS] Pollyann Savoy, MD     MDM Rules/Calculators/A&P Medical Decision Making Problems Addressed: Dysuria: acute illness or injury  Amount and/or Complexity of Data Reviewed Labs: ordered. Decision-making details documented in ED Course. Radiology: ordered and independent interpretation performed. Decision-making details documented in ED Course.  Risk Prescription drug management.     Final Clinical Impression(s) / ED Diagnoses Final diagnoses:  Dysuria    Rx / DC Orders ED Discharge Orders          Ordered    phenazopyridine (PYRIDIUM) 200 MG tablet  3 times daily        11/09/22  0026             Pollyann Savoy, MD 11/09/22 [phone]

## 2022-11-08 NOTE — ED Triage Notes (Signed)
Pt states she has been dx with UTI by [facility] x2 in the last few weeks. Given cipro both times and has almost completed the 2nd round of abx with no improvement.

## 2022-11-09 MED ORDER — PHENAZOPYRIDINE HCL 200 MG PO TABS: 200.0000 mg | ORAL_TABLET | Freq: Three times a day (TID) | ORAL | 0 refills | Status: DC

## 2022-11-10 ENCOUNTER — Telehealth: Payer: Self-pay

## 2022-11-10 NOTE — Transitions of Care (Post Inpatient/ED Visit) (Signed)
   11/10/2022  Name: Nichole Guerra MRN: [id number] DOB: 1995-06-04  Today's TOC FU Call Status: Today's TOC FU Call Status:: Unsuccessul Call (1st Attempt) Unsuccessful Call (1st Attempt) Date: 11/10/22  Attempted to reach the patient regarding the most recent Inpatient/ED visit.  Follow Up Plan: Additional outreach attempts will be made to reach the patient to complete the Transitions of Care (Post Inpatient/ED visit) call.   Signature Karena Addison, LPN [facility] Nurse Health Advisor Direct Dial [phone]

## 2022-11-11 ENCOUNTER — Ambulatory Visit: Payer: Self-pay | Admitting: Family Medicine

## 2022-11-15 NOTE — Transitions of Care (Post Inpatient/ED Visit) (Unsigned)
   11/15/2022  Name: Nichole Guerra MRN: [id number] DOB: Jan 02, 1996  Today's TOC FU Call Status: Today's TOC FU Call Status:: Unsuccessful Call (2nd Attempt) Unsuccessful Call (1st Attempt) Date: 11/10/22 Unsuccessful Call (2nd Attempt) Date: 11/15/22  Attempted to reach the patient regarding the most recent Inpatient/ED visit.  Follow Up Plan: No further outreach attempts will be made at this time. We have been unable to contact the patient.  Signature Karena Addison, LPN [facility] Nurse Health Advisor Direct Dial [phone]

## 2022-11-16 ENCOUNTER — Encounter: Payer: Self-pay | Admitting: Family Medicine

## 2022-11-16 ENCOUNTER — Telehealth (INDEPENDENT_AMBULATORY_CARE_PROVIDER_SITE_OTHER): Payer: BC Managed Care – PPO | Admitting: Family Medicine

## 2022-11-16 DIAGNOSIS — N3000 Acute cystitis without hematuria: Secondary | ICD-10-CM

## 2022-11-16 DIAGNOSIS — R300 Dysuria: Secondary | ICD-10-CM | POA: Diagnosis not present

## 2022-11-16 LAB — MICROSCOPIC EXAMINATION
RBC, Urine: NONE SEEN /hpf (ref 0–2)
Renal Epithel, UA: NONE SEEN /hpf
WBC, UA: NONE SEEN /hpf (ref 0–5)

## 2022-11-16 LAB — URINALYSIS, ROUTINE W REFLEX MICROSCOPIC
Bilirubin, UA: NEGATIVE
Glucose, UA: NEGATIVE
Ketones, UA: NEGATIVE
Nitrite, UA: POSITIVE — AB
Protein,UA: NEGATIVE
RBC, UA: NEGATIVE
Specific Gravity, UA: 1.01 (ref 1.005–1.030)
Urobilinogen, Ur: 0.2 mg/dL (ref 0.2–1.0)
pH, UA: 5.5 (ref 5.0–7.5)

## 2022-11-16 MED ORDER — CEPHALEXIN 500 MG PO CAPS
500.0000 mg | ORAL_CAPSULE | Freq: Two times a day (BID) | ORAL | 0 refills | Status: DC
Start: 2022-11-16 — End: 2022-11-27

## 2022-11-16 NOTE — Progress Notes (Signed)
Virtual Visit via Video   I connected with patient on 11/16/22 at 1500 by a video enabled telemedicine application and verified that I am speaking with the correct person using two identifiers.  Location patient: Home Location provider: [facility] Persons participating in the virtual visit: Patient and Provider  I discussed the limitations of evaluation and management by telemedicine and the availability of in person appointments. The patient expressed understanding and agreed to proceed.  Subjective:   HPI:  Pt presents today for  Chief Complaint  Patient presents with   Dysuria   Dysuria  This is a recurrent problem. The current episode started in the past 7 days. The problem occurs every urination. The problem has been waxing and waning. The quality of the pain is described as aching and burning. The pain is moderate. There has been no fever. She is Not sexually active. There is No history of pyelonephritis. Associated symptoms include frequency and urgency. Pertinent negatives include no chills, discharge, flank pain, hematuria, hesitancy, nausea, possible pregnancy, sweats or vomiting. She has tried increased fluids for the symptoms. The treatment provided no relief. Her past medical history is significant for recurrent UTIs.     Review of Systems  Constitutional:  Negative for chills, diaphoresis, fever, malaise/fatigue and weight loss.  Gastrointestinal:  Negative for nausea and vomiting.  Genitourinary:  Positive for dysuria, frequency and urgency. Negative for flank pain, hematuria and hesitancy.  All other systems reviewed and are negative.    Patient Active Problem List   Diagnosis Date Noted   Panic disorder 01/12/2021   Irregular periods 12/21/2020   Bipolar 2 disorder (HCC) 07/05/2018   Asthma, moderate persistent 02/02/2017   Environmental allergies 02/02/2017   Food allergy 02/02/2017   Depression with anxiety 02/02/2017    Obesity (BMI 35.0-39.9 without comorbidity) 02/02/2017    Social History   Tobacco Use   Smoking status: Never   Smokeless tobacco: Never  Substance Use Topics   Alcohol use: Not Currently    Comment: 1 once a year    Current Outpatient Medications:    cariprazine (VRAYLAR) 3 MG capsule, Take 1 capsule (3 mg total) by mouth daily., Disp: 90 capsule, Rfl: 0   cephALEXin (KEFLEX) 500 MG capsule, Take 1 capsule (500 mg total) by mouth 2 (two) times daily., Disp: 14 capsule, Rfl: 0   albuterol (VENTOLIN HFA) 108 (90 Base) MCG/ACT inhaler, Inhale 2 puffs into the lungs every 6 (six) hours as needed for wheezing or shortness of breath., Disp: 6.7 each, Rfl: 1   EPIPEN 2-PAK 0.3 MG/0.3ML SOAJ injection, Inject 0.3 mg into the muscle as needed for anaphylaxis., Disp: 1 each, Rfl: 1   famotidine (PEPCID) 20 MG tablet, Take 1 tablet (20 mg total) by mouth 2 (two) times daily., Disp: 180 tablet, Rfl: 1   fexofenadine (ALLEGRA) 180 MG tablet, Take 1 tablet (180 mg total) by mouth 2 (two) times daily as needed for allergies or rhinitis., Disp: 60 tablet, Rfl: 5   fluticasone furoate-vilanterol (BREO ELLIPTA) 100-25 MCG/ACT AEPB, Inhale 1 puff into the lungs daily., Disp: 90 each, Rfl: 3   JUNEL FE 1/20 1-20 MG-MCG tablet, TAKE 1 TABLET BY MOUTH EVERY DAY, Disp: 84 tablet, Rfl: 0   montelukast (SINGULAIR) 10 MG tablet, TAKE 1 TABLET BY MOUTH EVERYDAY AT BEDTIME, Disp: 90 tablet, Rfl: 1   Olopatadine-Mometasone ([name]) 665-25 MCG/ACT SUSP, Place 2 sprays into the nose 2 (two) times daily as needed., Disp: 29 g, Rfl: 0  ondansetron (ZOFRAN-ODT) 4 MG disintegrating tablet, Take 1 tablet (4 mg total) by mouth every 8 (eight) hours as needed for nausea or vomiting., Disp: 30 tablet, Rfl: 0   phenazopyridine (PYRIDIUM) 200 MG tablet, Take 1 tablet (200 mg total) by mouth 3 (three) times daily., Disp: 6 tablet, Rfl: 0   rizatriptan (MAXALT) 10 MG tablet, Take 1 tablet (10 mg total) by mouth as needed for  migraine. May repeat in 2 hours if needed, Disp: 9 tablet, Rfl: 0   sertraline (ZOLOFT) 100 MG tablet, Take 1 tablet (100 mg total) by mouth daily., Disp: 90 tablet, Rfl: 1   sucralfate (CARAFATE) 1 g tablet, Take 1 tablet (1 g total) by mouth 4 (four) times daily -  with meals and at bedtime., Disp: 60 tablet, Rfl: 0  Allergies  Allergen Reactions   Penicillins Anaphylaxis    Throat swells up.   Shellfish-Derived Products Anaphylaxis   Bactrim [Sulfamethoxazole-Trimethoprim] Hives   Hydrocodone Hives   Yellow Dyes (Non-Tartrazine) Rash    Objective:   There were no vitals taken for this visit.  Patient is well-developed, well-nourished in no acute distress.  Resting comfortably at home.  Head is normocephalic, atraumatic.  No labored breathing.  Speech is clear and coherent with logical content.  Patient is alert and oriented at baseline.    Assessment and Plan:   Nichole Guerra was seen today for dysuria.  Diagnoses and all orders for this visit:  Dysuria Urinalysis indicative of acute cystitis. Culture pending. Previous cultures reviewed and medications based off of results. Pt aware of red flags which require emergent evaluation and treatment.  -     Urinalysis, Routine w reflex microscopic -     Urine Culture -     Microscopic Examination  Acute cystitis without hematuria -     cephALEXin (KEFLEX) 500 MG capsule; Take 1 capsule (500 mg total) by mouth 2 (two) times daily.      Return if symptoms worsen or fail to improve.  Kari Baars, FNP-C [facility] [address] [place], [place] [address] [phone]  11/16/2022  Time spent with the patient: 15 minutes, of which >50% was spent in obtaining information about symptoms, reviewing previous labs, evaluations, and treatments, counseling about condition (please see the discussed topics above), and developing a plan to further investigate it; had a number of questions which I addressed.

## 2022-11-16 NOTE — Transitions of Care (Post Inpatient/ED Visit) (Signed)
   11/16/2022  Name: Nichole Guerra MRN: [id number] DOB: 1996/03/08  Today's TOC FU Call Status: Today's TOC FU Call Status:: Unsuccessful Call (2nd Attempt) Unsuccessful Call (1st Attempt) Date: 11/10/22 Unsuccessful Call (2nd Attempt) Date: 11/15/22  Attempted to reach the patient regarding the most recent Inpatient/ED visit.  Follow Up Plan: No further outreach attempts will be made at this time. We have been unable to contact the patient. Patient has already been seen in office Signature Karena Addison, LPN [facility] Nurse Health Advisor Direct Dial [phone]

## 2022-11-19 LAB — URINE CULTURE

## 2022-11-22 ENCOUNTER — Encounter: Payer: Self-pay | Admitting: Family Medicine

## 2022-11-22 DIAGNOSIS — J4540 Moderate persistent asthma, uncomplicated: Secondary | ICD-10-CM

## 2022-11-22 DIAGNOSIS — Z9109 Other allergy status, other than to drugs and biological substances: Secondary | ICD-10-CM

## 2022-11-23 ENCOUNTER — Telehealth: Payer: Self-pay

## 2022-11-23 NOTE — Transitions of Care (Post Inpatient/ED Visit) (Signed)
   11/23/2022  Name: Nichole Guerra MRN: [id number] DOB: [birth year]  Today's TOC FU Call Status: Unsuccessful Call (1st Attempt) Date: 11/23/22  Attempted to reach the patient regarding the most recent Inpatient/ED visit.  Follow Up Plan: Additional outreach attempts will be made to reach the patient to complete the Transitions of Care (Post Inpatient/ED visit) call.   Signature Karena Addison, LPN [facility] Nurse Health Advisor Direct Dial [phone]

## 2022-11-24 NOTE — Transitions of Care (Post Inpatient/ED Visit) (Signed)
   11/24/2022  Name: Nichole Guerra MRN: [id number] DOB: [birth year]  Today's TOC FU Call Status: Today's TOC FU Call Status:: Unsuccessful Call (2nd Attempt) Unsuccessful Call (1st Attempt) Date: 11/23/22 Unsuccessful Call (2nd Attempt) Date: 11/24/22  Attempted to reach the patient regarding the most recent Inpatient/ED visit.  Follow Up Plan: Additional outreach attempts will be made to reach the patient to complete the Transitions of Care (Post Inpatient/ED visit) call.   Signature Karena Addison, LPN [facility] Nurse Health Advisor Direct Dial [phone]

## 2022-11-25 MED ORDER — MONTELUKAST SODIUM 10 MG PO TABS: 10.0000 mg | ORAL_TABLET | Freq: Every day | ORAL | 1 refills | Status: DC

## 2022-11-25 MED ORDER — FLUCONAZOLE 150 MG PO TABS: 150.0000 mg | ORAL_TABLET | Freq: Once | ORAL | 0 refills | Status: AC

## 2022-11-26 ENCOUNTER — Other Ambulatory Visit: Payer: Self-pay

## 2022-11-26 ENCOUNTER — Inpatient Hospital Stay (HOSPITAL_COMMUNITY)
Admission: EM | Admit: 2022-11-26 | Discharge: 2022-11-27 | DRG: 193 | Disposition: A | Discharge status: Home / Self Care | Payer: BC Managed Care – PPO | Attending: Internal Medicine | Admitting: Internal Medicine

## 2022-11-26 ENCOUNTER — Emergency Department (HOSPITAL_COMMUNITY): Payer: BC Managed Care – PPO

## 2022-11-26 ENCOUNTER — Encounter (HOSPITAL_COMMUNITY): Payer: Self-pay

## 2022-11-26 DIAGNOSIS — Z87440 Personal history of urinary (tract) infections: Secondary | ICD-10-CM

## 2022-11-26 DIAGNOSIS — Z83438 Family history of other disorder of lipoprotein metabolism and other lipidemia: Secondary | ICD-10-CM

## 2022-11-26 DIAGNOSIS — E441 Mild protein-calorie malnutrition: Secondary | ICD-10-CM | POA: Diagnosis present

## 2022-11-26 DIAGNOSIS — R350 Frequency of micturition: Secondary | ICD-10-CM | POA: Diagnosis present

## 2022-11-26 DIAGNOSIS — R7989 Other specified abnormal findings of blood chemistry: Secondary | ICD-10-CM | POA: Diagnosis not present

## 2022-11-26 DIAGNOSIS — Z833 Family history of diabetes mellitus: Secondary | ICD-10-CM

## 2022-11-26 DIAGNOSIS — J4520 Mild intermittent asthma, uncomplicated: Secondary | ICD-10-CM

## 2022-11-26 DIAGNOSIS — J45909 Unspecified asthma, uncomplicated: Secondary | ICD-10-CM | POA: Diagnosis present

## 2022-11-26 DIAGNOSIS — R112 Nausea with vomiting, unspecified: Secondary | ICD-10-CM | POA: Diagnosis present

## 2022-11-26 DIAGNOSIS — R9431 Abnormal electrocardiogram [ECG] [EKG]: Secondary | ICD-10-CM | POA: Diagnosis not present

## 2022-11-26 DIAGNOSIS — J189 Pneumonia, unspecified organism: Secondary | ICD-10-CM | POA: Diagnosis present

## 2022-11-26 DIAGNOSIS — Z880 Allergy status to penicillin: Secondary | ICD-10-CM | POA: Diagnosis not present

## 2022-11-26 DIAGNOSIS — Z79899 Other long term (current) drug therapy: Secondary | ICD-10-CM

## 2022-11-26 DIAGNOSIS — R590 Localized enlarged lymph nodes: Secondary | ICD-10-CM | POA: Diagnosis present

## 2022-11-26 DIAGNOSIS — Z6841 Body Mass Index (BMI) 40.0 and over, adult: Secondary | ICD-10-CM | POA: Diagnosis not present

## 2022-11-26 DIAGNOSIS — Z8249 Family history of ischemic heart disease and other diseases of the circulatory system: Secondary | ICD-10-CM | POA: Diagnosis not present

## 2022-11-26 DIAGNOSIS — Z825 Family history of asthma and other chronic lower respiratory diseases: Secondary | ICD-10-CM

## 2022-11-26 DIAGNOSIS — Z1152 Encounter for screening for COVID-19: Secondary | ICD-10-CM

## 2022-11-26 DIAGNOSIS — E8809 Other disorders of plasma-protein metabolism, not elsewhere classified: Secondary | ICD-10-CM | POA: Diagnosis present

## 2022-11-26 DIAGNOSIS — F32A Depression, unspecified: Secondary | ICD-10-CM | POA: Diagnosis present

## 2022-11-26 DIAGNOSIS — R300 Dysuria: Secondary | ICD-10-CM | POA: Diagnosis present

## 2022-11-26 DIAGNOSIS — Z8674 Personal history of sudden cardiac arrest: Secondary | ICD-10-CM

## 2022-11-26 DIAGNOSIS — F419 Anxiety disorder, unspecified: Secondary | ICD-10-CM | POA: Diagnosis present

## 2022-11-26 DIAGNOSIS — R109 Unspecified abdominal pain: Secondary | ICD-10-CM | POA: Diagnosis present

## 2022-11-26 DIAGNOSIS — J181 Lobar pneumonia, unspecified organism: Secondary | ICD-10-CM | POA: Diagnosis present

## 2022-11-26 DIAGNOSIS — Z823 Family history of stroke: Secondary | ICD-10-CM | POA: Diagnosis not present

## 2022-11-26 DIAGNOSIS — Z7951 Long term (current) use of inhaled steroids: Secondary | ICD-10-CM

## 2022-11-26 DIAGNOSIS — E66813 Obesity, class 3: Secondary | ICD-10-CM | POA: Insufficient documentation

## 2022-11-26 DIAGNOSIS — J9601 Acute respiratory failure with hypoxia: Secondary | ICD-10-CM | POA: Insufficient documentation

## 2022-11-26 DIAGNOSIS — R0902 Hypoxemia: Secondary | ICD-10-CM

## 2022-11-26 DIAGNOSIS — E46 Unspecified protein-calorie malnutrition: Secondary | ICD-10-CM | POA: Diagnosis not present

## 2022-11-26 LAB — CBC WITH DIFFERENTIAL/PLATELET
Abs Immature Granulocytes: 0.35 10*3/uL — ABNORMAL HIGH (ref 0.00–0.07)
Basophils Absolute: 0.1 10*3/uL (ref 0.0–0.1)
Basophils Relative: 1 %
Eosinophils Absolute: 0 10*3/uL (ref 0.0–0.5)
Eosinophils Relative: 0 %
HCT: 35.8 % — ABNORMAL LOW (ref 36.0–46.0)
Hemoglobin: 12 g/dL (ref 12.0–15.0)
Immature Granulocytes: 4 %
Lymphocytes Relative: 17 %
Lymphs Abs: 1.5 10*3/uL (ref 0.7–4.0)
MCH: 27.9 pg (ref 26.0–34.0)
MCHC: 33.5 g/dL (ref 30.0–36.0)
MCV: 83.3 fL (ref 80.0–100.0)
Monocytes Absolute: 0.4 10*3/uL (ref 0.1–1.0)
Monocytes Relative: 5 %
Neutro Abs: 6.3 10*3/uL (ref 1.7–7.7)
Neutrophils Relative %: 73 %
Platelets: 393 10*3/uL (ref 150–400)
RBC: 4.3 MIL/uL (ref 3.87–5.11)
RDW: 14 % (ref 11.5–15.5)
WBC: 8.9 10*3/uL (ref 4.0–10.5)
nRBC: 0 % (ref 0.0–0.2)

## 2022-11-26 LAB — HIV ANTIBODY (ROUTINE TESTING W REFLEX): HIV Screen 4th Generation wRfx: NONREACTIVE

## 2022-11-26 LAB — RESPIRATORY PANEL BY PCR

## 2022-11-26 LAB — COMPREHENSIVE METABOLIC PANEL
ALT: 41 U/L (ref 0–44)
AST: 37 U/L (ref 15–41)
Albumin: 3.4 g/dL — ABNORMAL LOW (ref 3.5–5.0)
Alkaline Phosphatase: 63 U/L (ref 38–126)
Anion gap: 10 (ref 5–15)
BUN: 6 mg/dL (ref 6–20)
CO2: 22 mmol/L (ref 22–32)
Calcium: 8.6 mg/dL — ABNORMAL LOW (ref 8.9–10.3)
Chloride: 107 mmol/L (ref 98–111)
Creatinine, Ser: 0.72 mg/dL (ref 0.44–1.00)
GFR, Estimated: >60 mL/min (ref >60)
Glucose, Bld: 111 mg/dL — ABNORMAL HIGH (ref 70–99)
Potassium: 3.6 mmol/L (ref 3.5–5.1)
Sodium: 139 mmol/L (ref 135–145)
Total Bilirubin: 0.7 mg/dL (ref 0.3–1.2)
Total Protein: 7.2 g/dL (ref 6.5–8.1)

## 2022-11-26 LAB — URINALYSIS, ROUTINE W REFLEX MICROSCOPIC
Bilirubin Urine: NEGATIVE
Glucose, UA: NEGATIVE mg/dL
Hgb urine dipstick: NEGATIVE
Ketones, ur: NEGATIVE mg/dL
Leukocytes,Ua: NEGATIVE
Nitrite: NEGATIVE
Protein, ur: NEGATIVE mg/dL
Specific Gravity, Urine: 1.006 (ref 1.005–1.030)
pH: 6 (ref 5.0–8.0)

## 2022-11-26 LAB — PREGNANCY, URINE: Preg Test, Ur: NEGATIVE

## 2022-11-26 LAB — PHOSPHORUS: Phosphorus: 3.1 mg/dL (ref 2.5–4.6)

## 2022-11-26 LAB — MAGNESIUM: Magnesium: 2.2 mg/dL (ref 1.7–2.4)

## 2022-11-26 LAB — PROCALCITONIN: Procalcitonin: <0.1 ng/mL

## 2022-11-26 LAB — D-DIMER, QUANTITATIVE: D-Dimer, Quant: 0.94 ug/mL-FEU — ABNORMAL HIGH (ref 0.00–0.50)

## 2022-11-26 LAB — SARS CORONAVIRUS 2 BY RT PCR: SARS Coronavirus 2 by RT PCR: NEGATIVE

## 2022-11-26 MED ORDER — SODIUM CHLORIDE 0.9 % IV SOLN
100.0000 mg | Freq: Two times a day (BID) | INTRAVENOUS | Status: DC
  Administered 2022-11-26 – 2022-11-27 (×3): 100 mg via INTRAVENOUS
  Filled 2022-11-26 (×5): qty 100

## 2022-11-26 MED ORDER — ENSURE ENLIVE PO LIQD: 237.0000 mL | Freq: Two times a day (BID) | ORAL | Status: DC

## 2022-11-26 MED ORDER — IPRATROPIUM-ALBUTEROL 0.5-2.5 (3) MG/3ML IN SOLN
3.0000 mL | Freq: Once | RESPIRATORY_TRACT | Status: AC
  Administered 2022-11-26: 3 mL via RESPIRATORY_TRACT
  Filled 2022-11-26: qty 3

## 2022-11-26 MED ORDER — BUDESONIDE 0.5 MG/2ML IN SUSP
0.5000 mg | Freq: Two times a day (BID) | RESPIRATORY_TRACT | Status: DC
  Administered 2022-11-26 – 2022-11-27 (×3): 0.5 mg via RESPIRATORY_TRACT
  Filled 2022-11-26 (×3): qty 2

## 2022-11-26 MED ORDER — FLUTICASONE FUROATE-VILANTEROL 100-25 MCG/ACT IN AEPB: 1.0000 | INHALATION_SPRAY | Freq: Every day | RESPIRATORY_TRACT | Status: DC

## 2022-11-26 MED ORDER — PROCHLORPERAZINE EDISYLATE 10 MG/2ML IJ SOLN: 10.0000 mg | Freq: Four times a day (QID) | INTRAMUSCULAR | Status: DC | PRN

## 2022-11-26 MED ORDER — GUAIFENESIN-DM 100-10 MG/5ML PO SYRP
5.0000 mL | ORAL_SOLUTION | ORAL | Status: DC | PRN
  Filled 2022-11-26: qty 5

## 2022-11-26 MED ORDER — ACETAMINOPHEN 650 MG RE SUPP: 650.0000 mg | Freq: Four times a day (QID) | RECTAL | Status: DC | PRN

## 2022-11-26 MED ORDER — LEVOFLOXACIN IN D5W 750 MG/150ML IV SOLN
750.0000 mg | Freq: Once | INTRAVENOUS | Status: AC
  Administered 2022-11-26: 750 mg via INTRAVENOUS
  Filled 2022-11-26: qty 150

## 2022-11-26 MED ORDER — SODIUM CHLORIDE 0.9 % IV SOLN
1.0000 g | Freq: Every day | INTRAVENOUS | Status: DC
  Administered 2022-11-26 – 2022-11-27 (×2): 1 g via INTRAVENOUS
  Filled 2022-11-26 (×2): qty 10

## 2022-11-26 MED ORDER — ALBUTEROL SULFATE HFA 108 (90 BASE) MCG/ACT IN AERS: 2.0000 | INHALATION_SPRAY | Freq: Four times a day (QID) | RESPIRATORY_TRACT | Status: DC | PRN

## 2022-11-26 MED ORDER — ALBUTEROL SULFATE (2.5 MG/3ML) 0.083% IN NEBU
2.5000 mg | INHALATION_SOLUTION | Freq: Four times a day (QID) | RESPIRATORY_TRACT | Status: DC
  Administered 2022-11-26 – 2022-11-27 (×5): 2.5 mg via RESPIRATORY_TRACT
  Filled 2022-11-26 (×5): qty 3

## 2022-11-26 MED ORDER — ALBUTEROL SULFATE (2.5 MG/3ML) 0.083% IN NEBU
INHALATION_SOLUTION | RESPIRATORY_TRACT | Status: AC
  Administered 2022-11-26: 2.5 mg
  Filled 2022-11-26: qty 3

## 2022-11-26 MED ORDER — ENOXAPARIN SODIUM 40 MG/0.4ML IJ SOSY
40.0000 mg | PREFILLED_SYRINGE | Freq: Every day | INTRAMUSCULAR | Status: DC
  Administered 2022-11-26: 40 mg via SUBCUTANEOUS
  Filled 2022-11-26: qty 0.4

## 2022-11-26 MED ORDER — ENOXAPARIN SODIUM 60 MG/0.6ML IJ SOSY
55.0000 mg | PREFILLED_SYRINGE | Freq: Every day | INTRAMUSCULAR | Status: DC
  Administered 2022-11-27: 55 mg via SUBCUTANEOUS
  Filled 2022-11-26: qty 0.6

## 2022-11-26 MED ORDER — IOHEXOL 350 MG/ML SOLN
75.0000 mL | Freq: Once | INTRAVENOUS | Status: AC | PRN
  Administered 2022-11-26: 75 mL via INTRAVENOUS

## 2022-11-26 MED ORDER — ACETAMINOPHEN 325 MG PO TABS: 650.0000 mg | ORAL_TABLET | Freq: Four times a day (QID) | ORAL | Status: DC | PRN

## 2022-11-26 MED ORDER — SODIUM CHLORIDE 0.9 % IV BOLUS
1000.0000 mL | Freq: Once | INTRAVENOUS | Status: AC
  Administered 2022-11-26: 1000 mL via INTRAVENOUS

## 2022-11-26 NOTE — Progress Notes (Signed)
Lab at bedside to stick patient for labs. Patient is screaming out, " she can't take it anymore". Lab tech stated, " I was only able to get one blood culture and one set of labs". Patient is very tearful at this time. Plan of care ongoing.

## 2022-11-26 NOTE — Hospital Course (Signed)
27 year old female with a history of asthma, anxiety/depression presenting with 5-day history of coughing.  She had subsequently developed shortness of breath over the last day to 2 days prior to admission.  She has had some subjective fevers and chills.  Because of worsening shortness of breath, coughing, and development of dizziness, she presented for further evaluation and treatment.  She has had poor oral intake for the last 2 to 3 days.  She states that her coughing is largely nonproductive.  She denies any hemoptysis.  She does not smoke.  She denies any recent travels.  She has been compliant with her Breo and Singulair.  She has been using her albuterol nebulizer without much improvement.  She checked her oxygen saturation at home and it was 90%.  Notably, the patient has been struggling with urinary symptoms including dysuria, urinary frequency, flank pain, nausea and vomiting, and intermittent hematuria for the past 6 to 8 weeks.  She has had numerous ED visits and has been prescribed ciprofloxacin on 10/14/2022 and on 11/04/2022.  She states that she would have some mild improvement, but then her urinary symptoms would return. Notably, she had another ED visit on 11/16/2022 at [facility] ED.  She was prescribed cephalexin which she has tolerated without difficulty.  In the ED, the patient was afebrile hemodynamically stable.  Oxygen saturation was 80% on room air.  She was placed on 4 L with saturation of 96%.  WBC 8.9, hemoglobin 12.0, platelets 293,000.  Sodium 139, potassium 3.6, bicarbonate 22, serum creatinine 0.72.  AST 37, ALT 41, alkaline phosphatase 63, total bilirubin 0.7.  UA was negative for pyuria.  Chest x-ray showed right lower lobe opacity and increased interstitial markings. CTA chest was negative for PE.  It showed bilateral multifocal infiltrates, right greater than left.  There is bilateral hilar adenopathy.  The patient was given a dose of levofloxacin in the ED.  Ceftriaxone and  doxycycline were ordered.

## 2022-11-26 NOTE — Progress Notes (Addendum)
PROGRESS NOTE  Nichole Guerra ZOX:[id number] DOB: [birth year] DOA: 11/26/2022 PCP: Dettinger, Elige Radon, MD  Brief History:  27 year old female with a history of asthma, anxiety/depression presenting with 5-day history of coughing.  She had subsequently developed shortness of breath over the last day to 2 days prior to admission.  She has had some subjective fevers and chills.  Because of worsening shortness of breath, coughing, and development of dizziness, she presented for further evaluation and treatment.  She has had poor oral intake for the last 2 to 3 days.  She states that her coughing is largely nonproductive.  She denies any hemoptysis.  She does not smoke.  She denies any recent travels.  She has been compliant with her Breo and Singulair.  She has been using her albuterol nebulizer without much improvement.  She checked her oxygen saturation at home and it was 90%.  Notably, the patient has been struggling with urinary symptoms including dysuria, urinary frequency, flank pain, nausea and vomiting, and intermittent hematuria for the past 6 to 8 weeks.  She has had numerous ED visits and has been prescribed ciprofloxacin on 10/14/2022 and on 11/04/2022.  She states that she would have some mild improvement, but then her urinary symptoms would return. Notably, she had another ED visit on 11/16/2022 at [facility] ED.  She was prescribed cephalexin which she has tolerated without difficulty.  In the ED, the patient was afebrile hemodynamically stable.  Oxygen saturation was 80% on room air.  She was placed on 4 L with saturation of 96%.  WBC 8.9, hemoglobin 12.0, platelets 293,000.  Sodium 139, potassium 3.6, bicarbonate 22, serum creatinine 0.72.  AST 37, ALT 41, alkaline phosphatase 63, total bilirubin 0.7.  UA was negative for pyuria.  Chest x-ray showed right lower lobe opacity and increased interstitial markings. CTA chest was negative for PE.  It showed bilateral multifocal infiltrates, right  greater than left.  There is bilateral hilar adenopathy.  The patient was given a dose of levofloxacin in the ED.  Ceftriaxone and doxycycline were ordered.   Assessment/Plan: Acute respiratory failure with hypoxia -Secondary to pneumonia in the setting of asthma -Stable on 4 L nasal cannula -Wean oxygen for saturation greater than 92%  Lobar pneumonia -CTA chest negative for PE; shows bilateral multifocal infiltrates, right greater than left -Continue ceftriaxone and doxycycline  -Urine Legionella antigen -Urine Streptococcus pneumoniae antigen -Viral respiratory panel -Check PCT  Asthma -Continue Singulair -Albuterol and Pulmicort nebs  Morbid obesity -BMI 44.81 -Lifestyle modification  Personal history of penicillin allergy -Patient has tolerated cephalexin without any difficulty   Total time spent 50 minutes.  Greater than 50% spent face to face counseling and coordinating care.    Family Communication:   no Family at bedside  Consultants:  none  Code Status:  FULL   DVT Prophylaxis:   [place] Lovenox   Procedures: As Listed in Progress Note Above  Antibiotics: Levoflox 7/12 Ceftriaxone 7/13>> Doxy 7/13>>     Subjective: Pt complains of dyspnea on exertion.  Cough is largely nonproductive.  Denies f/c, cp, nv/d, abd pain, dysuria  Objective: Vitals:   11/26/22 0100 11/26/22 0202 11/26/22 0505 11/26/22 0524  BP: (!) 117/99  139/87 122/89  Pulse: 86  70 85  Resp:   17 18  Temp:   97.7 F (36.5 C) 97.6 F (36.4 C)  TempSrc:   Oral Oral  SpO2: 92% 93% 95% 96%  Weight:  Height:        Intake/Output Summary (Last 24 hours) at 11/26/2022 0750 Last data filed at 11/26/2022 0600 Gross per 24 hour  Intake 1129.12 ml  Output --  Net 1129.12 ml   Weight change:  Exam:  General:  Pt is alert, follows commands appropriately, not in acute distress HEENT: No icterus, No thrush, No neck mass, [place]/AT Cardiovascular: RRR, S1/S2, no rubs, no  gallops Respiratory: Bibasilar rales.  No wheezing. Abdomen: Soft/+BS, non tender, non distended, no guarding Extremities: No edema, No lymphangitis, No petechiae, No rashes, no synovitis   Data Reviewed: I have personally reviewed following labs and imaging studies Basic Metabolic Panel: Recent Labs  Lab 11/26/22 0201 11/26/22 0629  NA 139  --   K 3.6  --   CL 107  --   CO2 22  --   GLUCOSE 111*  --   BUN 6  --   CREATININE 0.72  --   CALCIUM 8.6*  --   MG  --  2.2  PHOS  --  3.1   Liver Function Tests: Recent Labs  Lab 11/26/22 0201  AST 37  ALT 41  ALKPHOS 63  BILITOT 0.7  PROT 7.2  ALBUMIN 3.4*   No results for input(s): "LIPASE", "AMYLASE" in the last 168 hours. No results for input(s): "AMMONIA" in the last 168 hours. Coagulation Profile: No results for input(s): "INR", "PROTIME" in the last 168 hours. CBC: Recent Labs  Lab 11/26/22 0201  WBC 8.9  NEUTROABS 6.3  HGB 12.0  HCT 35.8*  MCV 83.3  PLT 393   Cardiac Enzymes: No results for input(s): "CKTOTAL", "CKMB", "CKMBINDEX", "TROPONINI" in the last 168 hours. BNP: Invalid input(s): "POCBNP" CBG: No results for input(s): "GLUCAP" in the last 168 hours. HbA1C: No results for input(s): "HGBA1C" in the last 72 hours. Urine analysis:    Component Value Date/Time   COLORURINE YELLOW 11/26/2022 0200   APPEARANCEUR CLEAR 11/26/2022 0200   APPEARANCEUR Clear 11/16/2022 1503   LABSPEC 1.006 11/26/2022 0200   PHURINE 6.0 11/26/2022 0200   GLUCOSEU NEGATIVE 11/26/2022 0200   HGBUR NEGATIVE 11/26/2022 0200   BILIRUBINUR NEGATIVE 11/26/2022 0200   BILIRUBINUR Negative 11/16/2022 1503   KETONESUR NEGATIVE 11/26/2022 0200   PROTEINUR NEGATIVE 11/26/2022 0200   NITRITE NEGATIVE 11/26/2022 0200   LEUKOCYTESUR NEGATIVE 11/26/2022 0200   Sepsis Labs: @LABRCNTIP (procalcitonin:4,lacticidven:4) ) Recent Results (from the past 240 hour(s))  Urine Culture     Status: None   Collection Time: 11/16/22  3:03  PM   Specimen: Urine   UR  Result Value Ref Range Status   Urine Culture, Routine Final report  Final   Organism ID, Bacteria Comment  Final    Comment: Mixed urogenital flora 10,000-25,000 colony forming units per mL   Microscopic Examination     Status: Abnormal   Collection Time: 11/16/22  3:03 PM   Urine  Result Value Ref Range Status   WBC, UA None seen 0 - 5 /hpf Final   RBC, Urine None seen 0 - 2 /hpf Final   Epithelial Cells (non renal) 0-10 0 - 10 /hpf Final   Renal Epithel, UA None seen None seen /hpf Final   Bacteria, UA Few (A) None seen/Few Final  SARS Coronavirus 2 by RT PCR (hospital order, performed in [facility] hospital lab) *cepheid single result test* Anterior Nasal Swab     Status: None   Collection Time: 11/26/22  2:02 AM   Specimen: Anterior Nasal Swab  Result  Value Ref Range Status   SARS Coronavirus 2 by RT PCR NEGATIVE NEGATIVE Final    Comment: (NOTE) SARS-CoV-2 target nucleic acids are NOT DETECTED.  The SARS-CoV-2 RNA is generally detectable in upper and lower respiratory specimens during the acute phase of infection. The lowest concentration of SARS-CoV-2 viral copies this assay can detect is 250 copies / mL. A negative result does not preclude SARS-CoV-2 infection and should not be used as the sole basis for treatment or other patient management decisions.  A negative result may occur with improper specimen collection / handling, submission of specimen other than nasopharyngeal swab, presence of viral mutation(s) within the areas targeted by this assay, and inadequate number of viral copies (<250 copies / mL). A negative result must be combined with clinical observations, patient history, and epidemiological information.  Fact Sheet for Patients:   RoadLapTop.co.za  Fact Sheet for Healthcare Providers: http://kim-miller.com/  This test is not yet approved or  cleared by the [place] FDA  and has been authorized for detection and/or diagnosis of SARS-CoV-2 by FDA under an Emergency Use Authorization (EUA).  This EUA will remain in effect (meaning this test can be used) for the duration of the COVID-19 declaration under Section 564(b)(1) of the Act, 21 U.S.C. section 360bbb-3(b)(1), unless the authorization is terminated or revoked sooner.  Performed at [facility], [address] [place], [place] [address]      Scheduled Meds:  enoxaparin (LOVENOX) injection  40 mg Subcutaneous Daily   feeding supplement  237 mL Oral BID BM   fluticasone furoate-vilanterol  1 puff Inhalation Daily   Continuous Infusions:  cefTRIAXone (ROCEPHIN)  IV     doxycycline (VIBRAMYCIN) IV      Procedures/Studies: CT Angio Chest PE W and/or Wo Contrast  Result Date: 11/26/2022 CLINICAL DATA:  Dizziness and decreased oxygen saturation on room air. EXAM: CT ANGIOGRAPHY CHEST WITH CONTRAST TECHNIQUE: Multidetector CT imaging of the chest was performed using the standard protocol during bolus administration of intravenous contrast. Multiplanar CT image reconstructions and MIPs were obtained to evaluate the vascular anatomy. RADIATION DOSE REDUCTION: This exam was performed according to the departmental dose-optimization program which includes automated exposure control, adjustment of the mA and/or kV according to patient size and/or use of iterative reconstruction technique. CONTRAST:  75mL OMNIPAQUE IOHEXOL 350 MG/ML SOLN COMPARISON:  None Available. FINDINGS: Cardiovascular: The thoracic aorta is normal in appearance. Satisfactory opacification of the pulmonary arteries to the segmental level. No evidence of pulmonary embolism. Normal heart size. No pericardial effusion. Mediastinum/Nodes: Mild to moderate severity bilateral hilar lymphadenopathy is noted. Thyroid gland, trachea, and esophagus demonstrate no significant findings. Lungs/Pleura: Bilateral mild to moderate severity multifocal infiltrates  are seen, right greater than left. No pleural effusion or pneumothorax is identified. Upper Abdomen: No acute abnormality. Musculoskeletal: No chest wall abnormality. No acute or significant osseous findings. Review of the MIP images confirms the above findings. IMPRESSION: 1. No evidence of pulmonary embolism. 2. Bilateral moderate severity multifocal infiltrates, right greater than left. 3. Mild to moderate severity bilateral hilar lymphadenopathy. Electronically Signed   By: Aram Candela M.D.   On: 11/26/2022 04:02   DG Chest Port 1 View  Result Date: 11/26/2022 CLINICAL DATA:  Dizziness and shortness of breath. EXAM: PORTABLE CHEST 1 VIEW COMPARISON:  November 22, 2022 FINDINGS: The heart size and mediastinal contours are within normal limits. Mild diffusely increased interstitial lung markings are seen, most prominent within the perihilar regions. This represents a new finding when compared to  the prior study. Mild lateral right basilar atelectasis and/or infiltrate is also present. No pleural effusion or pneumothorax is identified. The visualized skeletal structures are unremarkable. IMPRESSION: 1. Mild lateral right basilar atelectasis and/or infiltrate. 2. Additional findings which may represent sequelae associated with mild pulmonary vascular congestion. Electronically Signed   By: Aram Candela M.D.   On: 11/26/2022 01:54   CT Renal Stone Study  Result Date: 11/09/2022 CLINICAL DATA:  Abdominal/flank pain, stone suspected EXAM: CT ABDOMEN AND PELVIS WITHOUT CONTRAST TECHNIQUE: Multidetector CT imaging of the abdomen and pelvis was performed following the standard protocol without IV contrast. RADIATION DOSE REDUCTION: This exam was performed according to the departmental dose-optimization program which includes automated exposure control, adjustment of the mA and/or kV according to patient size and/or use of iterative reconstruction technique. COMPARISON:  None Available. FINDINGS: Lower chest:  No acute abnormality. Hepatobiliary: No focal liver abnormality. No gallstones, gallbladder wall thickening, or pericholecystic fluid. No biliary dilatation. Pancreas: No focal lesion. Normal pancreatic contour. No surrounding inflammatory changes. No main pancreatic ductal dilatation. Spleen: Normal in size without focal abnormality. Adrenals/Urinary Tract: No adrenal nodule bilaterally. No nephrolithiasis and no hydronephrosis. No definite contour-deforming renal mass. No ureterolithiasis or hydroureter. The urinary bladder is unremarkable. Stomach/Bowel: Stomach is within normal limits. No evidence of bowel wall thickening or dilatation. Appendix appears normal. Vascular/Lymphatic: No abdominal aorta or iliac aneurysm. No abdominal, pelvic, or inguinal lymphadenopathy. Reproductive: Uterus and bilateral adnexa are unremarkable. Other: No intraperitoneal free fluid. No intraperitoneal free gas. No organized fluid collection. Musculoskeletal: No abdominal wall hernia or abnormality. No suspicious lytic or blastic osseous lesions. No acute displaced fracture. IMPRESSION: No acute intra-abdominal or intrapelvic abnormality with limited evaluation on this noncontrast study. Electronically Signed   By: Tish Frederickson M.D.   On: 11/09/2022 00:08    Catarina Hartshorn, DO  [facility]  If 7PM-7AM, please contact night-coverage [email or web address] Password [facility] 11/26/2022, 7:50 AM   LOS: 0 days

## 2022-11-26 NOTE — Plan of Care (Signed)
  Problem: Education: Goal: Knowledge of General Education information will improve Description: Including pain rating scale, medication(s)/side effects and non-pharmacologic comfort measures Outcome: Progressing   Problem: Health Behavior/Discharge Planning: Goal: Ability to manage health-related needs will improve Outcome: Progressing   Problem: Clinical Measurements: Goal: Ability to maintain clinical measurements within normal limits will improve Outcome: Progressing   Problem: Clinical Measurements: Goal: Respiratory complications will improve Outcome: Progressing   

## 2022-11-26 NOTE — H&P (Signed)
History and Physical    Patient: Nichole Guerra AVW:[id number] DOB: 02-29-1996 DOA: 11/26/2022 DOS: the patient was seen and examined on 11/26/2022 PCP: Dettinger, Elige Radon, MD  Patient coming from: Home  Chief Complaint:  Chief Complaint  Patient presents with   Dizziness    Pt stated that she has been fighting off a cold this past week and this evening she started feeling dizzy and lightheaded. Pt stated that she has a hx of asthma and has used her nebulizer tx 2-3 times today. Pt was sating at 80 on RA. Pt also stated that she hasn't been eating or drinking much the last couple of days. Pt also has been dealing with urology issues for the last 8-9  weeks and has been on several antibiotics   HPI: Nichole Guerra is a 27 y.o. female with medical history significant of Asthma who presents to the emergency department accompanied by father due to 5-day onset of cough with intermittent production of brown sputum, occasional mild shortness of breath and subjective fever few days ago which she presumed to be due to cold symptoms.  Patient has not been eating much since the last 2 to 3 days and yesterday in the evening, she complained of dizziness and shortness of breath, father checked blood glucose level which was normal and checked O2 sat which was 90% on room air. Within the last 8 weeks, patient presented with hematuria which was thought to be due to kidney stone that already passed and she has been on 3 rounds of different antibiotics since onset of symptoms and was to have an appointment with a urologist today. Due to patient presenting with dizziness PTA, dad checked on side effects of the antibiotic (cephalexin) and noted dizziness as one of the several side effects, so he presumed that patient's symptoms were due to medication side effects, so he brought her to the ED for further evaluation and management.  ED Course:  In the emergency department, temperature 98.4 F, pulse 86, BP 117/99,  respiratory rate 15/min, O2 sat was 80% on room air on arrival to the ED, supplemental oxygen via [place] at 4 LPM was provided with improvement in O2 sats to 92-95%.  Workup in the ED showed normal CBC except for hematocrit of 35.8.  BMP was normal except for blood glucose of 111, albumin 3.4, D-dimer 0.94, urinalysis was normal, pregnancy test was negative.  SARS coronavirus 2 was negative. Chest x-ray showed mild lateral right basilar atelectasis and/or infiltrate.  Additional findings which may represent sequela associated with mild pulmonary vascular congestion CT angiography chest with contrast showed no evidence of pulmonary embolism.  Bilateral moderate severity multifocal infiltrates, right greater than left.  Mild to moderate severity bilateral hilar lymphadenopathy. She was started on IV levofloxacin, present treatment with DuoNeb was provided and IV hydration was given.  Hospitalist was asked to admit patient for further evaluation and management.  Review of Systems: Review of systems as noted in the HPI. All other systems reviewed and are negative.   Past Medical History:  Diagnosis Date   Allergy    Anxiety    Asthma    Cardiac arrest [facility])    Contraceptive management 12/17/2012   Depression    History reviewed. No pertinent surgical history.  Social History:  reports that she has never smoked. She has never used smokeless tobacco. She reports that she does not currently use alcohol. She reports that she does not use drugs.   Allergies  Allergen Reactions  Penicillins Anaphylaxis    Throat swells up.   Shellfish-Derived Products Anaphylaxis   Bactrim [Sulfamethoxazole-Trimethoprim] Hives   Hydrocodone Hives   Yellow Dyes (Non-Tartrazine) Rash    Family History  Problem Relation Age of Onset   Hypertension Mother    Other Mother        pre-cancerous cells on cervix   Asthma Father    Diabetes Father    Cancer Maternal Uncle        prostate, bladder, bone   Cancer  Paternal Uncle        lung   Asthma Maternal Grandmother    Miscarriages / Stillbirths Maternal Grandmother    Hypertension Maternal Grandmother    Heart disease Maternal Grandfather    Hyperlipidemia Paternal Grandmother    Asthma Paternal Grandmother    COPD Paternal Grandmother    Stroke Paternal Grandfather    Stroke Other        paternal great grandma     Prior to Admission medications   Medication Sig Start Date End Date Taking? Authorizing Provider  cariprazine (VRAYLAR) 3 MG capsule Take 1 capsule (3 mg total) by mouth daily. 08/16/22   Dettinger, Elige Radon, MD  albuterol (VENTOLIN HFA) 108 (90 Base) MCG/ACT inhaler Inhale 2 puffs into the lungs every 6 (six) hours as needed for wheezing or shortness of breath. 08/16/22   Dettinger, Elige Radon, MD  cephALEXin (KEFLEX) 500 MG capsule Take 1 capsule (500 mg total) by mouth 2 (two) times daily. 11/16/22   Sonny Masters, FNP  EPIPEN 2-PAK 0.3 MG/0.3ML SOAJ injection Inject 0.3 mg into the muscle as needed for anaphylaxis. 12/06/21   Alfonse Spruce, MD  famotidine (PEPCID) 20 MG tablet Take 1 tablet (20 mg total) by mouth 2 (two) times daily. 08/16/22   Dettinger, Elige Radon, MD  fexofenadine (ALLEGRA) 180 MG tablet Take 1 tablet (180 mg total) by mouth 2 (two) times daily as needed for allergies or rhinitis. 11/24/21   Alfonse Spruce, MD  fluticasone furoate-vilanterol (BREO ELLIPTA) 100-25 MCG/ACT AEPB Inhale 1 puff into the lungs daily. 09/17/21   Dettinger, Elige Radon, MD  JUNEL FE 1/20 1-20 MG-MCG tablet TAKE 1 TABLET BY MOUTH EVERY DAY 02/02/22   Cyril Mourning A, NP  montelukast (SINGULAIR) 10 MG tablet Take 1 tablet (10 mg total) by mouth at bedtime. 11/25/22   Dettinger, Elige Radon, MD  Olopatadine-Mometasone Cristal Generous) [phone] MCG/ACT SUSP Place 2 sprays into the nose 2 (two) times daily as needed. 06/24/22   Alfonse Spruce, MD  ondansetron (ZOFRAN-ODT) 4 MG disintegrating tablet Take 1 tablet (4 mg total) by mouth every 8  (eight) hours as needed for nausea or vomiting. 08/16/22   Dettinger, Elige Radon, MD  phenazopyridine (PYRIDIUM) 200 MG tablet Take 1 tablet (200 mg total) by mouth 3 (three) times daily. 11/09/22   Pollyann Savoy, MD  rizatriptan (MAXALT) 10 MG tablet Take 1 tablet (10 mg total) by mouth as needed for migraine. May repeat in 2 hours if needed 08/16/22   Dettinger, Elige Radon, MD  sertraline (ZOLOFT) 100 MG tablet Take 1 tablet (100 mg total) by mouth daily. 08/16/22   Dettinger, Elige Radon, MD  sucralfate (CARAFATE) 1 g tablet Take 1 tablet (1 g total) by mouth 4 (four) times daily -  with meals and at bedtime. 08/17/22   Dettinger, Elige Radon, MD    Physical Exam: BP 122/89 (BP Location: Left Arm)   Pulse 85   Temp 97.6 F (36.4 C) (  Oral)   Resp 18   Ht 5\' 2"  (1.575 m)   Wt 111.1 kg   SpO2 96%   BMI 44.81 kg/m   General: 27 y.o. year-old female, obese, well developed well nourished in no acute distress.  Alert and oriented x3. HEENT: NCAT, EOMI Neck: Supple, trachea medial Cardiovascular: Regular rate and rhythm with no rubs or gallops.  No thyromegaly or JVD noted.  No lower extremity edema. 2/4 pulses in all 4 extremities. Respiratory: Diffuse rhonchi on auscultation with no wheezes.   Abdomen: Soft, nontender nondistended with normal bowel sounds x4 quadrants. Muskuloskeletal: No cyanosis, clubbing or edema noted bilaterally Neuro: CN II-XII intact, strength 5/5 x 4, sensation, reflexes intact Skin: No ulcerative lesions noted or rashes Psychiatry: Judgement and insight appear normal. Mood is appropriate for condition and setting          Labs on Admission:  Basic Metabolic Panel: Recent Labs  Lab 11/26/22 0201  NA 139  K 3.6  CL 107  CO2 22  GLUCOSE 111*  BUN 6  CREATININE 0.72  CALCIUM 8.6*   Liver Function Tests: Recent Labs  Lab 11/26/22 0201  AST 37  ALT 41  ALKPHOS 63  BILITOT 0.7  PROT 7.2  ALBUMIN 3.4*   No results for input(s): "LIPASE", "AMYLASE" in the last  168 hours. No results for input(s): "AMMONIA" in the last 168 hours. CBC: Recent Labs  Lab 11/26/22 0201  WBC 8.9  NEUTROABS 6.3  HGB 12.0  HCT 35.8*  MCV 83.3  PLT 393   Cardiac Enzymes: No results for input(s): "CKTOTAL", "CKMB", "CKMBINDEX", "TROPONINI" in the last 168 hours.  BNP (last 3 results) No results for input(s): "BNP" in the last 8760 hours.  ProBNP (last 3 results) No results for input(s): "PROBNP" in the last 8760 hours.  CBG: No results for input(s): "GLUCAP" in the last 168 hours.  Radiological Exams on Admission: CT Angio Chest PE W and/or Wo Contrast  Result Date: 11/26/2022 CLINICAL DATA:  Dizziness and decreased oxygen saturation on room air. EXAM: CT ANGIOGRAPHY CHEST WITH CONTRAST TECHNIQUE: Multidetector CT imaging of the chest was performed using the standard protocol during bolus administration of intravenous contrast. Multiplanar CT image reconstructions and MIPs were obtained to evaluate the vascular anatomy. RADIATION DOSE REDUCTION: This exam was performed according to the departmental dose-optimization program which includes automated exposure control, adjustment of the mA and/or kV according to patient size and/or use of iterative reconstruction technique. CONTRAST:  75mL OMNIPAQUE IOHEXOL 350 MG/ML SOLN COMPARISON:  None Available. FINDINGS: Cardiovascular: The thoracic aorta is normal in appearance. Satisfactory opacification of the pulmonary arteries to the segmental level. No evidence of pulmonary embolism. Normal heart size. No pericardial effusion. Mediastinum/Nodes: Mild to moderate severity bilateral hilar lymphadenopathy is noted. Thyroid gland, trachea, and esophagus demonstrate no significant findings. Lungs/Pleura: Bilateral mild to moderate severity multifocal infiltrates are seen, right greater than left. No pleural effusion or pneumothorax is identified. Upper Abdomen: No acute abnormality. Musculoskeletal: No chest wall abnormality. No acute  or significant osseous findings. Review of the MIP images confirms the above findings. IMPRESSION: 1. No evidence of pulmonary embolism. 2. Bilateral moderate severity multifocal infiltrates, right greater than left. 3. Mild to moderate severity bilateral hilar lymphadenopathy. Electronically Signed   By: Aram Candela M.D.   On: 11/26/2022 04:02   DG Chest Port 1 View  Result Date: 11/26/2022 CLINICAL DATA:  Dizziness and shortness of breath. EXAM: PORTABLE CHEST 1 VIEW COMPARISON:  November 22, 2022 FINDINGS:  The heart size and mediastinal contours are within normal limits. Mild diffusely increased interstitial lung markings are seen, most prominent within the perihilar regions. This represents a new finding when compared to the prior study. Mild lateral right basilar atelectasis and/or infiltrate is also present. No pleural effusion or pneumothorax is identified. The visualized skeletal structures are unremarkable. IMPRESSION: 1. Mild lateral right basilar atelectasis and/or infiltrate. 2. Additional findings which may represent sequelae associated with mild pulmonary vascular congestion. Electronically Signed   By: Aram Candela M.D.   On: 11/26/2022 01:54    EKG: I independently viewed the EKG done and my findings are as followed: Normal sinus rhythm at a rate of 92 bpm with prolonged QTc of 515 ms  Assessment/Plan Present on Admission:  CAP (community acquired pneumonia)  Principal Problem:   CAP (community acquired pneumonia) Active Problems:   Acute respiratory failure with hypoxia (HCC)   Prolonged QT interval   Elevated d-dimer   Asthma, chronic   Obesity, Class III, BMI 40-49.9 (morbid obesity) (HCC)   Hypoalbuminemia due to protein-calorie malnutrition (HCC)  CAP POA Patient was started on levofloxacin, we shall continue with ceftriaxone and doxycycline(per pharmacy recommendation) at this time with plan to de-escalate/discontinue based on blood culture, sputum culture, urine  Legionella, strep pneumo and procalcitonin SARS coronavirus 2 was negative Respiratory panel pending Continue Tylenol as needed Continue Robitussin, incentive spirometry, flutter valve   Acute respiratory failure with hypoxia Continue supplemental oxygen via [place] to maintain O2 sat > 92% with plan to wean patient off oxygen as tolerated.  Patient does not use oxygen at baseline  Prolonged QT interval QTc 515 ms Avoid QT prolonging drugs Magnesium level will be checked Repeat EKG in the morning  Hypoalbuminemia possibly secondary to mild protein calorie malnutrition Albumin 3.4, protein supplement will be provided  Elevated D-dimer D-dimer 0.94, CT angiography of chest ruled out pulmonary embolism  Asthma (not in acute exacerbation) Continue albuterol, Breo Ellipta  Morbid obesity (BMI 44.81) Diet and lifestyle modification Patient may need to follow-up with PCP for weight loss program    DVT prophylaxis: Lovenox  Advance Care Planning: Full code  Consults: None  Family Communication: Dad at bedside (all questions answered to satisfaction)  Severity of Illness: The appropriate patient status for this patient is INPATIENT. Inpatient status is judged to be reasonable and necessary in order to provide the required intensity of service to ensure the patient's safety. The patient's presenting symptoms, physical exam findings, and initial radiographic and laboratory data in the context of their chronic comorbidities is felt to place them at high risk for further clinical deterioration. Furthermore, it is not anticipated that the patient will be medically stable for discharge from the hospital within 2 midnights of admission.   * I certify that at the point of admission it is my clinical judgment that the patient will require inpatient hospital care spanning beyond 2 midnights from the point of admission due to high intensity of service, high risk for further deterioration and high  frequency of surveillance required.*  Author: Frankey Shown, DO 11/26/2022 6:06 AM  For on call review [email or web address].

## 2022-11-26 NOTE — Progress Notes (Signed)
   11/26/22 1246  TOC Brief Assessment  Patient has primary care physician Yes  Home environment has been reviewed From Home  Prior level of function: Independent  Prior/Current Home Services No current home services  Social Determinants of Health Reivew SDOH reviewed no interventions necessary  Readmission risk has been reviewed Yes [place] [facility])  Transition of care needs transition of care needs identified, TOC will continue to follow (Pt needs Good RX card or any appropriate discount coupon for meds at DC.)   CSW completed [facility] consult with pt.  For medication assistance.  Pt recently lost job, benefits to end soon. Pt concerned about scripts at DC.  CSW contacted [facility]- they have coupons for certain meds, once it is clear what pt needs at DC- we can follow up. Pt may need Good RX card.

## 2022-11-26 NOTE — ED Provider Notes (Signed)
[name] EMERGENCY DEPARTMENT AT [facility] Provider Note   CSN: [id number] Arrival date & time: 11/26/22  0033     History  Chief Complaint  Patient presents with   Dizziness    Pt stated that she has been fighting off a cold this past week and this evening she started feeling dizzy and lightheaded. Pt stated that she has a hx of asthma and has used her nebulizer tx 2-3 times today. Pt was sating at 80 on RA. Pt also stated that she hasn't been eating or drinking much the last couple of days. Pt also has been dealing with urology issues for the last 8-9  weeks and has been on several antibiotics    Nichole Guerra is a 27 y.o. female.  Patient is a 27 year old female with history of asthma, bipolar, and recent UTIs.  Patient presenting today with complaints of dizziness and shortness of breath.  She has been seeing urology recently for episodes of hematuria thought may be related to a kidney stone.  She has also been off and on antibiotics intermittently for the past few weeks.  This evening, she began to feel dizzy and weak, then asked her father to bring her for evaluation.  She denies to me she is having any chest pain.  She does describe a cough that is intermittently productive.  No fevers.  The history is provided by the patient.       Home Medications Prior to Admission medications   Medication Sig Start Date End Date Taking? Authorizing Provider  cariprazine (VRAYLAR) 3 MG capsule Take 1 capsule (3 mg total) by mouth daily. 08/16/22   Dettinger, Elige Radon, MD  albuterol (VENTOLIN HFA) 108 (90 Base) MCG/ACT inhaler Inhale 2 puffs into the lungs every 6 (six) hours as needed for wheezing or shortness of breath. 08/16/22   Dettinger, Elige Radon, MD  cephALEXin (KEFLEX) 500 MG capsule Take 1 capsule (500 mg total) by mouth 2 (two) times daily. 11/16/22   Sonny Masters, FNP  EPIPEN 2-PAK 0.3 MG/0.3ML SOAJ injection Inject 0.3 mg into the muscle as needed for anaphylaxis. 12/06/21    Alfonse Spruce, MD  famotidine (PEPCID) 20 MG tablet Take 1 tablet (20 mg total) by mouth 2 (two) times daily. 08/16/22   Dettinger, Elige Radon, MD  fexofenadine (ALLEGRA) 180 MG tablet Take 1 tablet (180 mg total) by mouth 2 (two) times daily as needed for allergies or rhinitis. 11/24/21   Alfonse Spruce, MD  fluticasone furoate-vilanterol (BREO ELLIPTA) 100-25 MCG/ACT AEPB Inhale 1 puff into the lungs daily. 09/17/21   Dettinger, Elige Radon, MD  JUNEL FE 1/20 1-20 MG-MCG tablet TAKE 1 TABLET BY MOUTH EVERY DAY 02/02/22   Cyril Mourning A, NP  montelukast (SINGULAIR) 10 MG tablet Take 1 tablet (10 mg total) by mouth at bedtime. 11/25/22   Dettinger, Elige Radon, MD  Olopatadine-Mometasone Cristal Generous) [phone] MCG/ACT SUSP Place 2 sprays into the nose 2 (two) times daily as needed. 06/24/22   Alfonse Spruce, MD  ondansetron (ZOFRAN-ODT) 4 MG disintegrating tablet Take 1 tablet (4 mg total) by mouth every 8 (eight) hours as needed for nausea or vomiting. 08/16/22   Dettinger, Elige Radon, MD  phenazopyridine (PYRIDIUM) 200 MG tablet Take 1 tablet (200 mg total) by mouth 3 (three) times daily. 11/09/22   Pollyann Savoy, MD  rizatriptan (MAXALT) 10 MG tablet Take 1 tablet (10 mg total) by mouth as needed for migraine. May repeat in 2 hours if  needed 08/16/22   Dettinger, Elige Radon, MD  sertraline (ZOLOFT) 100 MG tablet Take 1 tablet (100 mg total) by mouth daily. 08/16/22   Dettinger, Elige Radon, MD  sucralfate (CARAFATE) 1 g tablet Take 1 tablet (1 g total) by mouth 4 (four) times daily -  with meals and at bedtime. 08/17/22   Dettinger, Elige Radon, MD      Allergies    Penicillins, Shellfish-derived products, Bactrim [sulfamethoxazole-trimethoprim], Hydrocodone, and Yellow dyes (non-tartrazine)    Review of Systems   Review of Systems  All other systems reviewed and are negative.   Physical Exam Updated Vital Signs BP (!) 117/99   Pulse 86   Temp 98.4 F (36.9 C) (Oral)   Ht 5\' 2"  (1.575 m)   Wt  111.1 kg   SpO2 92%   BMI 44.81 kg/m  Physical Exam Vitals and nursing note reviewed.  Constitutional:      General: She is not in acute distress.    Appearance: She is well-developed. She is not diaphoretic.  HENT:     Head: Normocephalic and atraumatic.  Cardiovascular:     Rate and Rhythm: Normal rate and regular rhythm.     Heart sounds: No murmur heard.    No friction rub. No gallop.  Pulmonary:     Effort: Pulmonary effort is normal. No respiratory distress.     Breath sounds: Normal breath sounds. No wheezing.  Abdominal:     General: Bowel sounds are normal. There is no distension.     Palpations: Abdomen is soft.     Tenderness: There is no abdominal tenderness.  Musculoskeletal:        General: No swelling or tenderness. Normal range of motion.     Cervical back: Normal range of motion and neck supple.     Right lower leg: No edema.     Left lower leg: No edema.  Skin:    General: Skin is warm and dry.  Neurological:     General: No focal deficit present.     Mental Status: She is alert and oriented to person, place, and time.     ED Results / Procedures / Treatments   Labs (all labs ordered are listed, but only abnormal results are displayed) Labs Reviewed  SARS CORONAVIRUS 2 BY RT PCR  D-DIMER, QUANTITATIVE  CBC WITH DIFFERENTIAL/PLATELET  COMPREHENSIVE METABOLIC PANEL    EKG EKG Interpretation Date/Time:  Saturday November 26 2022 02:07:07 EDT Ventricular Rate:  92 PR Interval:  133 QRS Duration:  94 QT Interval:  416 QTC Calculation: 515 R Axis:   47  Text Interpretation: Sinus rhythm Low voltage, precordial leads Prolonged QT interval Baseline wander in lead(s) II III aVF V6 Confirmed by Geoffery Lyons ([id number]) on 11/26/2022 3:12:32 AM  Radiology No results found.  Procedures Procedures    Medications Ordered in ED Medications  sodium chloride 0.9 % bolus 1,000 mL (has no administration in time range)  ipratropium-albuterol (DUONEB) 0.5-2.5  (3) MG/3ML nebulizer solution 3 mL (has no administration in time range)    ED Course/ Medical Decision Making/ A&P  Patient is a 27 year old female with history of asthma.  Patient presenting with complaints of cough, shortness of breath, and dizziness as described in the HPI.  She arrives here hypoxic with oxygen saturation of 80%, but otherwise stable vital signs.  Physical examination basically unremarkable.  Workup initiated including CBC, metabolic panel, D-dimer, urinalysis, and urine pregnancy test.  Studies all basically unremarkable except for mildly elevated D-dimer.  Chest x-ray shows mild lateral right basilar atelectasis and/or infiltrate.  CTA obtained showing no evidence for pulmonary embolism, but does show moderate severity multifocal infiltrates, right greater than left.  Patient has been treated with IV Levaquin.  She has also been given a DuoNeb, but remains hypoxic.  I feel as though patient will require admission.  She is currently requiring 3 L nasal cannula to maintain oxygen saturations in the mid 90s.  I have spoken with the hospitalist who agrees to admit.  Final Clinical Impression(s) / ED Diagnoses Final diagnoses:  None    Rx / DC Orders ED Discharge Orders     None         Geoffery Lyons, MD 11/26/22 [phone]

## 2022-11-27 DIAGNOSIS — J181 Lobar pneumonia, unspecified organism: Secondary | ICD-10-CM | POA: Diagnosis not present

## 2022-11-27 DIAGNOSIS — J9601 Acute respiratory failure with hypoxia: Secondary | ICD-10-CM | POA: Diagnosis not present

## 2022-11-27 LAB — CBC
HCT: 32.6 % — ABNORMAL LOW (ref 36.0–46.0)
Hemoglobin: 10.5 g/dL — ABNORMAL LOW (ref 12.0–15.0)
MCH: 27.9 pg (ref 26.0–34.0)
MCHC: 32.2 g/dL (ref 30.0–36.0)
MCV: 86.7 fL (ref 80.0–100.0)
Platelets: 340 10*3/uL (ref 150–400)
RBC: 3.76 MIL/uL — ABNORMAL LOW (ref 3.87–5.11)
RDW: 14.4 % (ref 11.5–15.5)
WBC: 10.5 10*3/uL (ref 4.0–10.5)
nRBC: 0 % (ref 0.0–0.2)

## 2022-11-27 LAB — COMPREHENSIVE METABOLIC PANEL
ALT: 42 U/L (ref 0–44)
AST: 28 U/L (ref 15–41)
Albumin: 2.9 g/dL — ABNORMAL LOW (ref 3.5–5.0)
Alkaline Phosphatase: 51 U/L (ref 38–126)
Anion gap: 9 (ref 5–15)
BUN: 6 mg/dL (ref 6–20)
CO2: 24 mmol/L (ref 22–32)
Calcium: 8.3 mg/dL — ABNORMAL LOW (ref 8.9–10.3)
Chloride: 106 mmol/L (ref 98–111)
Creatinine, Ser: 0.79 mg/dL (ref 0.44–1.00)
GFR, Estimated: >60 mL/min (ref >60)
Glucose, Bld: 110 mg/dL — ABNORMAL HIGH (ref 70–99)
Potassium: 3.2 mmol/L — ABNORMAL LOW (ref 3.5–5.1)
Sodium: 139 mmol/L (ref 135–145)
Total Bilirubin: 0.4 mg/dL (ref 0.3–1.2)
Total Protein: 6.2 g/dL — ABNORMAL LOW (ref 6.5–8.1)

## 2022-11-27 LAB — STREP PNEUMONIAE URINARY ANTIGEN: Strep Pneumo Urinary Antigen: NEGATIVE

## 2022-11-27 MED ORDER — CEFDINIR 300 MG PO CAPS: 300.0000 mg | ORAL_CAPSULE | Freq: Two times a day (BID) | ORAL | Status: DC

## 2022-11-27 MED ORDER — POTASSIUM CHLORIDE CRYS ER 20 MEQ PO TBCR
40.0000 meq | EXTENDED_RELEASE_TABLET | Freq: Once | ORAL | Status: AC
  Administered 2022-11-27: 40 meq via ORAL
  Filled 2022-11-27: qty 2

## 2022-11-27 MED ORDER — CEFDINIR 300 MG PO CAPS: 300.0000 mg | ORAL_CAPSULE | Freq: Two times a day (BID) | ORAL | 0 refills | Status: DC

## 2022-11-27 MED ORDER — DOXYCYCLINE HYCLATE 100 MG PO TABS: 100.0000 mg | ORAL_TABLET | Freq: Two times a day (BID) | ORAL | 0 refills | Status: DC

## 2022-11-27 MED ORDER — DOXYCYCLINE HYCLATE 100 MG PO TABS: 100.0000 mg | ORAL_TABLET | Freq: Two times a day (BID) | ORAL | Status: DC

## 2022-11-27 MED ORDER — ALBUTEROL SULFATE (2.5 MG/3ML) 0.083% IN NEBU: 2.5000 mg | INHALATION_SOLUTION | RESPIRATORY_TRACT | 1 refills | Status: DC | PRN

## 2022-11-27 NOTE — Progress Notes (Signed)
Discharge teaching given, no further questions at this time, pt waiting for family to arrive.

## 2022-11-27 NOTE — Discharge Summary (Signed)
Physician Discharge Summary   Patient: Nichole Guerra MRN: [id number] DOB: 1995/12/14  Admit date:     11/26/2022  Discharge date: 11/27/22  Discharge Physician: Onalee Hua Dayln Tugwell   PCP: Dettinger, Elige Radon, MD   Recommendations at discharge:   Please follow up with primary care provider within 1-2 weeks  Please repeat BMP and CBC in one week     Hospital Course: 27 year old female with a history of asthma, anxiety/depression presenting with 5-day history of coughing.  She had subsequently developed shortness of breath over the last day to 2 days prior to admission.  She has had some subjective fevers and chills.  Because of worsening shortness of breath, coughing, and development of dizziness, she presented for further evaluation and treatment.  She has had poor oral intake for the last 2 to 3 days.  She states that her coughing is largely nonproductive.  She denies any hemoptysis.  She does not smoke.  She denies any recent travels.  She has been compliant with her Breo and Singulair.  She has been using her albuterol nebulizer without much improvement.  She checked her oxygen saturation at home and it was 90%.  Notably, the patient has been struggling with urinary symptoms including dysuria, urinary frequency, flank pain, nausea and vomiting, and intermittent hematuria for the past 6 to 8 weeks.  She has had numerous ED visits and has been prescribed ciprofloxacin on 10/14/2022 and on 11/04/2022.  She states that she would have some mild improvement, but then her urinary symptoms would return. Notably, she had another ED visit on 11/16/2022 at [facility] ED.  She was prescribed cephalexin which she has tolerated without difficulty.  In the ED, the patient was afebrile hemodynamically stable.  Oxygen saturation was 80% on room air.  She was placed on 4 L with saturation of 96%.  WBC 8.9, hemoglobin 12.0, platelets 293,000.  Sodium 139, potassium 3.6, bicarbonate 22, serum creatinine 0.72.  AST 37, ALT 41,  alkaline phosphatase 63, total bilirubin 0.7.  UA was negative for pyuria.  Chest x-ray showed right lower lobe opacity and increased interstitial markings. CTA chest was negative for PE.  It showed bilateral multifocal infiltrates, right greater than left.  There is bilateral hilar adenopathy.  The patient was given a dose of levofloxacin in the ED.  Ceftriaxone and doxycycline were ordered.  Assessment and Plan: Acute respiratory failure with hypoxia -Secondary to pneumonia in the setting of asthma -Stable on 4 L nasal cannula>>weaned to RA -Wean oxygen for saturation greater than 92% -ambulatory pulse ox on RA on the day of d/c did not show desaturation <94%   Lobar pneumonia -CTA chest negative for PE; shows bilateral multifocal infiltrates, right greater than left -Continue ceftriaxone and doxycycline  -Viral respiratory panel--neg -Check PCT<0.10 -d/c home with cefdinir and doxy x 6 more days   Asthma -Continue Singulair -Albuterol and Pulmicort nebs--continue   Morbid obesity -BMI 44.81 -Lifestyle modification   Personal history of penicillin allergy -Patient has tolerated cephalexin without any difficulty -tolerated ceftriaxone during this hospitalization without difficulty       Consultants: none Procedures performed: none  Disposition: Home Diet recommendation:  Regular diet DISCHARGE MEDICATION: Allergies as of 11/27/2022       Reactions   Penicillins Anaphylaxis   Throat swells up.   Shellfish-derived Products Anaphylaxis   Bactrim [sulfamethoxazole-trimethoprim] Hives   Hydrocodone Hives   Yellow Dyes (non-tartrazine) Rash        Medication List     STOP taking these  medications    cariprazine 3 MG capsule Commonly known as: Vraylar   cephALEXin 500 MG capsule Commonly known as: Keflex   ciprofloxacin 500 MG tablet Commonly known as: CIPRO   phenazopyridine 200 MG tablet Commonly known as: PYRIDIUM       TAKE these medications     albuterol (2.5 MG/3ML) 0.083% nebulizer solution Commonly known as: PROVENTIL Inhale into the lungs.   albuterol 108 (90 Base) MCG/ACT inhaler Commonly known as: VENTOLIN HFA Inhale 2 puffs into the lungs every 6 (six) hours as needed for wheezing or shortness of breath.   cefdinir 300 MG capsule Commonly known as: OMNICEF Take 1 capsule (300 mg total) by mouth every 12 (twelve) hours.   doxycycline 100 MG tablet Commonly known as: VIBRA-TABS Take 1 tablet (100 mg total) by mouth every 12 (twelve) hours.   EpiPen 2-Pak 0.3 MG/0.3ML Soaj injection Generic drug: EPINEPHrine Inject 0.3 mg into the muscle as needed for anaphylaxis.   fexofenadine 180 MG tablet Commonly known as: ALLEGRA Take 1 tablet (180 mg total) by mouth 2 (two) times daily as needed for allergies or rhinitis.   fluticasone furoate-vilanterol 100-25 MCG/ACT Aepb Commonly known as: BREO ELLIPTA Inhale 1 puff into the lungs daily.   montelukast 10 MG tablet Commonly known as: SINGULAIR Take 1 tablet (10 mg total) by mouth at bedtime.   ondansetron 4 MG disintegrating tablet Commonly known as: ZOFRAN-ODT Take 1 tablet (4 mg total) by mouth every 8 (eight) hours as needed for nausea or vomiting.   [name] 403-47 MCG/ACT Susp Generic drug: Olopatadine-Mometasone Place 2 sprays into the nose 2 (two) times daily as needed.   sertraline 100 MG tablet Commonly known as: ZOLOFT Take 1 tablet (100 mg total) by mouth daily.        Discharge Exam: Filed Weights   11/26/22 0052  Weight: 111.1 kg   HEENT:  /AT, No thrush, no icterus CV:  RRR, no rub, no S3, no S4 Lung:  bibasilar rales.  No wheeze Abd:  soft/+BS, NT Ext:  No edema, no lymphangitis, no synovitis, no rash   Condition at discharge: stable  The results of significant diagnostics from this hospitalization (including imaging, microbiology, ancillary and laboratory) are listed below for reference.   Imaging Studies: CT Angio Chest PE  W and/or Wo Contrast  Result Date: 11/26/2022 CLINICAL DATA:  Dizziness and decreased oxygen saturation on room air. EXAM: CT ANGIOGRAPHY CHEST WITH CONTRAST TECHNIQUE: Multidetector CT imaging of the chest was performed using the standard protocol during bolus administration of intravenous contrast. Multiplanar CT image reconstructions and MIPs were obtained to evaluate the vascular anatomy. RADIATION DOSE REDUCTION: This exam was performed according to the departmental dose-optimization program which includes automated exposure control, adjustment of the mA and/or kV according to patient size and/or use of iterative reconstruction technique. CONTRAST:  75mL OMNIPAQUE IOHEXOL 350 MG/ML SOLN COMPARISON:  None Available. FINDINGS: Cardiovascular: The thoracic aorta is normal in appearance. Satisfactory opacification of the pulmonary arteries to the segmental level. No evidence of pulmonary embolism. Normal heart size. No pericardial effusion. Mediastinum/Nodes: Mild to moderate severity bilateral hilar lymphadenopathy is noted. Thyroid gland, trachea, and esophagus demonstrate no significant findings. Lungs/Pleura: Bilateral mild to moderate severity multifocal infiltrates are seen, right greater than left. No pleural effusion or pneumothorax is identified. Upper Abdomen: No acute abnormality. Musculoskeletal: No chest wall abnormality. No acute or significant osseous findings. Review of the MIP images confirms the above findings. IMPRESSION: 1. No evidence of pulmonary embolism. 2.  Bilateral moderate severity multifocal infiltrates, right greater than left. 3. Mild to moderate severity bilateral hilar lymphadenopathy. Electronically Signed   By: Aram Candela M.D.   On: 11/26/2022 04:02   DG Chest Port 1 View  Result Date: 11/26/2022 CLINICAL DATA:  Dizziness and shortness of breath. EXAM: PORTABLE CHEST 1 VIEW COMPARISON:  November 22, 2022 FINDINGS: The heart size and mediastinal contours are within normal  limits. Mild diffusely increased interstitial lung markings are seen, most prominent within the perihilar regions. This represents a new finding when compared to the prior study. Mild lateral right basilar atelectasis and/or infiltrate is also present. No pleural effusion or pneumothorax is identified. The visualized skeletal structures are unremarkable. IMPRESSION: 1. Mild lateral right basilar atelectasis and/or infiltrate. 2. Additional findings which may represent sequelae associated with mild pulmonary vascular congestion. Electronically Signed   By: Aram Candela M.D.   On: 11/26/2022 01:54   CT Renal Stone Study  Result Date: 11/09/2022 CLINICAL DATA:  Abdominal/flank pain, stone suspected EXAM: CT ABDOMEN AND PELVIS WITHOUT CONTRAST TECHNIQUE: Multidetector CT imaging of the abdomen and pelvis was performed following the standard protocol without IV contrast. RADIATION DOSE REDUCTION: This exam was performed according to the departmental dose-optimization program which includes automated exposure control, adjustment of the mA and/or kV according to patient size and/or use of iterative reconstruction technique. COMPARISON:  None Available. FINDINGS: Lower chest: No acute abnormality. Hepatobiliary: No focal liver abnormality. No gallstones, gallbladder wall thickening, or pericholecystic fluid. No biliary dilatation. Pancreas: No focal lesion. Normal pancreatic contour. No surrounding inflammatory changes. No main pancreatic ductal dilatation. Spleen: Normal in size without focal abnormality. Adrenals/Urinary Tract: No adrenal nodule bilaterally. No nephrolithiasis and no hydronephrosis. No definite contour-deforming renal mass. No ureterolithiasis or hydroureter. The urinary bladder is unremarkable. Stomach/Bowel: Stomach is within normal limits. No evidence of bowel wall thickening or dilatation. Appendix appears normal. Vascular/Lymphatic: No abdominal aorta or iliac aneurysm. No abdominal, pelvic,  or inguinal lymphadenopathy. Reproductive: Uterus and bilateral adnexa are unremarkable. Other: No intraperitoneal free fluid. No intraperitoneal free gas. No organized fluid collection. Musculoskeletal: No abdominal wall hernia or abnormality. No suspicious lytic or blastic osseous lesions. No acute displaced fracture. IMPRESSION: No acute intra-abdominal or intrapelvic abnormality with limited evaluation on this noncontrast study. Electronically Signed   By: Tish Frederickson M.D.   On: 11/09/2022 00:08    Microbiology: Results for orders placed or performed during the hospital encounter of 11/26/22  SARS Coronavirus 2 by RT PCR (hospital order, performed in [facility] hospital lab) *cepheid single result test* Anterior Nasal Swab     Status: None   Collection Time: 11/26/22  2:02 AM   Specimen: Anterior Nasal Swab  Result Value Ref Range Status   SARS Coronavirus 2 by RT PCR NEGATIVE NEGATIVE Final    Comment: (NOTE) SARS-CoV-2 target nucleic acids are NOT DETECTED.  The SARS-CoV-2 RNA is generally detectable in upper and lower respiratory specimens during the acute phase of infection. The lowest concentration of SARS-CoV-2 viral copies this assay can detect is 250 copies / mL. A negative result does not preclude SARS-CoV-2 infection and should not be used as the sole basis for treatment or other patient management decisions.  A negative result may occur with improper specimen collection / handling, submission of specimen other than nasopharyngeal swab, presence of viral mutation(s) within the areas targeted by this assay, and inadequate number of viral copies (<250 copies / mL). A negative result must be combined with clinical observations, patient history,  and epidemiological information.  Fact Sheet for Patients:   RoadLapTop.co.za  Fact Sheet for Healthcare Providers: http://kim-miller.com/  This test is not yet approved or  cleared by  the [place] FDA and has been authorized for detection and/or diagnosis of SARS-CoV-2 by FDA under an Emergency Use Authorization (EUA).  This EUA will remain in effect (meaning this test can be used) for the duration of the COVID-19 declaration under Section 564(b)(1) of the Act, 21 U.S.C. section 360bbb-3(b)(1), unless the authorization is terminated or revoked sooner.  Performed at [facility], [address] [place], [place] [address]   Culture, blood (routine x 2) Call MD if unable to obtain prior to antibiotics being given     Status: None (Preliminary result)   Collection Time: 11/26/22  6:09 AM   Specimen: Right Antecubital; Blood  Result Value Ref Range Status   Specimen Description   Final    RIGHT ANTECUBITAL BOTTLES DRAWN AEROBIC AND ANAEROBIC   Special Requests Blood Culture adequate volume  Final   Culture   Final    NO GROWTH 1 DAY Performed at [facility], [address] [place], [place] [address]    Report Status PENDING  Incomplete  Respiratory (~20 pathogens) panel by PCR     Status: None   Collection Time: 11/26/22  6:30 AM   Specimen: Nasopharyngeal Swab; Respiratory  Result Value Ref Range Status   Adenovirus NOT DETECTED NOT DETECTED Final   Coronavirus 229E NOT DETECTED NOT DETECTED Final    Comment: (NOTE) The Coronavirus on the Respiratory Panel, DOES NOT test for the novel  Coronavirus (2019 nCoV)    Coronavirus HKU1 NOT DETECTED NOT DETECTED Final   Coronavirus NL63 NOT DETECTED NOT DETECTED Final   Coronavirus OC43 NOT DETECTED NOT DETECTED Final   Metapneumovirus NOT DETECTED NOT DETECTED Final   Rhinovirus / Enterovirus NOT DETECTED NOT DETECTED Final   Influenza A NOT DETECTED NOT DETECTED Final   Influenza B NOT DETECTED NOT DETECTED Final   Parainfluenza Virus 1 NOT DETECTED NOT DETECTED Final   Parainfluenza Virus 2 NOT DETECTED NOT DETECTED Final   Parainfluenza Virus 3 NOT DETECTED NOT DETECTED Final   Parainfluenza Virus 4 NOT  DETECTED NOT DETECTED Final   Respiratory Syncytial Virus NOT DETECTED NOT DETECTED Final   Bordetella pertussis NOT DETECTED NOT DETECTED Final   Bordetella Parapertussis NOT DETECTED NOT DETECTED Final   Chlamydophila pneumoniae NOT DETECTED NOT DETECTED Final   Mycoplasma pneumoniae NOT DETECTED NOT DETECTED Final    Comment: Performed at [facility], [address], [place], [place] [address]  Culture, blood (routine x 2) Call MD if unable to obtain prior to antibiotics being given     Status: None (Preliminary result)   Collection Time: 11/26/22  7:10 AM   Specimen: Left Antecubital; Blood  Result Value Ref Range Status   Specimen Description   Final    LEFT ANTECUBITAL BOTTLES DRAWN AEROBIC AND ANAEROBIC   Special Requests Blood Culture adequate volume  Final   Culture   Final    NO GROWTH 1 DAY Performed at [facility], [address], [place], [place] [address]    Report Status PENDING  Incomplete    Labs: CBC: Recent Labs  Lab 11/26/22 0201 11/27/22 0436  WBC 8.9 10.5  NEUTROABS 6.3  --   HGB 12.0 10.5*  HCT 35.8* 32.6*  MCV 83.3 86.7  PLT 393 340   Basic Metabolic Panel: Recent Labs  Lab 11/26/22 0201  11/26/22 0629 11/27/22 0436  NA 139  --  139  K 3.6  --  3.2*  CL 107  --  106  CO2 22  --  24  GLUCOSE 111*  --  110*  BUN 6  --  6  CREATININE 0.72  --  0.79  CALCIUM 8.6*  --  8.3*  MG  --  2.2  --   PHOS  --  3.1  --    Liver Function Tests: Recent Labs  Lab 11/26/22 0201 11/27/22 0436  AST 37 28  ALT 41 42  ALKPHOS 63 51  BILITOT 0.7 0.4  PROT 7.2 6.2*  ALBUMIN 3.4* 2.9*   CBG: No results for input(s): "GLUCAP" in the last 168 hours.  Discharge time spent: greater than 30 minutes.  Signed: Catarina Hartshorn, MD [facility] 11/27/2022

## 2022-11-28 ENCOUNTER — Telehealth: Payer: Self-pay

## 2022-11-28 NOTE — Transitions of Care (Post Inpatient/ED Visit) (Signed)
   11/28/2022  Name: Nichole Guerra MRN: [id number] DOB: 28-Oct-1995  Today's TOC FU Call Status: Today's TOC FU Call Status:: Unsuccessul Call (1st Attempt) Unsuccessful Call (1st Attempt) Date: 11/28/22  Attempted to reach the patient regarding the most recent Inpatient/ED visit.  Follow Up Plan: Additional outreach attempts will be made to reach the patient to complete the Transitions of Care (Post Inpatient/ED visit) call.   Signature Karena Addison, LPN [facility] Nurse Health Advisor Direct Dial [phone]

## 2022-11-29 NOTE — Transitions of Care (Post Inpatient/ED Visit) (Signed)
   11/29/2022  Name: Nichole Guerra MRN: [id number] DOB: [birth year]  Today's TOC FU Call Status: Today's TOC FU Call Status:: Unsuccessful Call (2nd Attempt) Unsuccessful Call (1st Attempt) Date: 11/28/22 Unsuccessful Call (2nd Attempt) Date: 11/29/22  Attempted to reach the patient regarding the most recent Inpatient/ED visit.  Follow Up Plan: Additional outreach attempts will be made to reach the patient to complete the Transitions of Care (Post Inpatient/ED visit) call.   Signature Karena Addison, LPN [facility] Nurse Health Advisor Direct Dial [phone]

## 2022-11-29 NOTE — Transitions of Care (Post Inpatient/ED Visit) (Signed)
   11/29/2022  Name: Nichole Guerra MRN: [id number] DOB: April 15, 1996  Today's TOC FU Call Status: Today's TOC FU Call Status:: Unsuccessful Call (3rd Attempt) Unsuccessful Call (1st Attempt) Date: 11/23/22 Unsuccessful Call (2nd Attempt) Date: 11/24/22 Unsuccessful Call (3rd Attempt) Date: 11/29/22  Attempted to reach the patient regarding the most recent Inpatient/ED visit.  Follow Up Plan: No further outreach attempts will be made at this time. We have been unable to contact the patient.  Signature Karena Addison, LPN [facility] Nurse Health Advisor Direct Dial [phone]

## 2022-11-30 LAB — LEGIONELLA PNEUMOPHILA SEROGP 1 UR AG: L. pneumophila Serogp 1 Ur Ag: NEGATIVE

## 2022-12-01 LAB — CULTURE, BLOOD (ROUTINE X 2)
Culture: NO GROWTH
Culture: NO GROWTH
Special Requests: ADEQUATE
Special Requests: ADEQUATE

## 2022-12-01 NOTE — Transitions of Care (Post Inpatient/ED Visit) (Signed)
   12/01/2022  Name: Nichole Guerra MRN: [id number] DOB: [birth year]  Today's TOC FU Call Status: Today's TOC FU Call Status:: Unsuccessful Call (3rd Attempt) Unsuccessful Call (1st Attempt) Date: 11/28/22 Unsuccessful Call (2nd Attempt) Date: 11/29/22 Unsuccessful Call (3rd Attempt) Date: 12/01/22  Attempted to reach the patient regarding the most recent Inpatient/ED visit.  Follow Up Plan: No further outreach attempts will be made at this time. We have been unable to contact the patient.  Signature Karena Addison, LPN [facility] Nurse Health Advisor Direct Dial [phone]

## 2022-12-05 ENCOUNTER — Ambulatory Visit: Payer: Self-pay | Admitting: Family Medicine

## 2022-12-09 ENCOUNTER — Encounter: Payer: Self-pay | Admitting: Family Medicine

## 2022-12-09 ENCOUNTER — Telehealth (INDEPENDENT_AMBULATORY_CARE_PROVIDER_SITE_OTHER): Payer: BC Managed Care – PPO | Admitting: Family Medicine

## 2022-12-09 DIAGNOSIS — J189 Pneumonia, unspecified organism: Secondary | ICD-10-CM

## 2022-12-09 DIAGNOSIS — F3181 Bipolar II disorder: Secondary | ICD-10-CM | POA: Diagnosis not present

## 2022-12-09 DIAGNOSIS — J4540 Moderate persistent asthma, uncomplicated: Secondary | ICD-10-CM

## 2022-12-09 DIAGNOSIS — F418 Other specified anxiety disorders: Secondary | ICD-10-CM | POA: Diagnosis not present

## 2022-12-09 DIAGNOSIS — E876 Hypokalemia: Secondary | ICD-10-CM

## 2022-12-09 NOTE — Progress Notes (Signed)
Virtual Visit via MyChart video note  I connected with Nichole Guerra on 12/09/22 at [phone] by video and verified that I am speaking with the correct person using two identifiers. Nichole Guerra is currently located at home and patient are currently with her during visit. The provider, Elige Radon Michaelene Dutan, MD is located in their office at time of visit.  Call ended at [phone]  I discussed the limitations, risks, security and privacy concerns of performing an evaluation and management service by video and the availability of in person appointments. I also discussed with the patient that there may be a patient responsible charge related to this service. The patient expressed understanding and agreed to proceed.   History and Present Illness: Depression and anxiety She stopped vraylar because of coverage and is taking zoloft and is doing well she says things are going well. She feels like emotions are doing well. She is sleeping and resting well.   Cap f/u  She was in for pneumonia on the 13th and 14th. She had kidney stone before.  She has a little congestion in the heat.  Her cough is resolved and she is mostly feeling back to normal. She still on breo for asthma and back to baseline.   1. Depression with anxiety   2. Bipolar 2 disorder (HCC)   3. Moderate persistent asthma without complication   4. Community acquired pneumonia, unspecified laterality   5. Hypokalemia     Outpatient Encounter Medications as of 12/09/2022  Medication Sig   albuterol (PROVENTIL) (2.5 MG/3ML) 0.083% nebulizer solution Inhale 3 mLs (2.5 mg total) into the lungs every 4 (four) hours as needed for wheezing or shortness of breath.   albuterol (VENTOLIN HFA) 108 (90 Base) MCG/ACT inhaler Inhale 2 puffs into the lungs every 6 (six) hours as needed for wheezing or shortness of breath.   EPIPEN 2-PAK 0.3 MG/0.3ML SOAJ injection Inject 0.3 mg into the muscle as needed for anaphylaxis.   fexofenadine (ALLEGRA) 180 MG tablet  Take 1 tablet (180 mg total) by mouth 2 (two) times daily as needed for allergies or rhinitis.   fluticasone furoate-vilanterol (BREO ELLIPTA) 100-25 MCG/ACT AEPB Inhale 1 puff into the lungs daily.   montelukast (SINGULAIR) 10 MG tablet Take 1 tablet (10 mg total) by mouth at bedtime.   Olopatadine-Mometasone ([name]) [id number] MCG/ACT SUSP Place 2 sprays into the nose 2 (two) times daily as needed.   ondansetron (ZOFRAN-ODT) 4 MG disintegrating tablet Take 1 tablet (4 mg total) by mouth every 8 (eight) hours as needed for nausea or vomiting.   sertraline (ZOLOFT) 100 MG tablet Take 1 tablet (100 mg total) by mouth daily.   [DISCONTINUED] cefdinir (OMNICEF) 300 MG capsule Take 1 capsule (300 mg total) by mouth every 12 (twelve) hours.   [DISCONTINUED] doxycycline (VIBRA-TABS) 100 MG tablet Take 1 tablet (100 mg total) by mouth every 12 (twelve) hours.   No facility-administered encounter medications on file as of 12/09/2022.    Review of Systems  Constitutional:  Negative for chills and fever.  HENT:  Positive for congestion. Negative for ear discharge and ear pain.   Eyes:  Negative for redness and visual disturbance.  Respiratory:  Negative for cough, chest tightness, shortness of breath and wheezing.   Cardiovascular:  Negative for chest pain and leg swelling.  Genitourinary:  Negative for difficulty urinating and dysuria.  Musculoskeletal:  Negative for back pain and gait problem.  Skin:  Negative for rash.  Neurological:  Negative for light-headedness and headaches.  Psychiatric/Behavioral:  Negative for agitation and behavioral problems.   All other systems reviewed and are negative.   Observations/Objective: Patient sounds comfortable and in no acute distress  Assessment and Plan: Problem List Items Addressed This Visit       Respiratory   Asthma, moderate persistent   CAP (community acquired pneumonia)   Relevant Orders   CBC with Differential/Platelet   CMP14+EGFR      Other   Depression with anxiety - Primary   Bipolar 2 disorder (HCC)   Other Visit Diagnoses     Hypokalemia       Relevant Orders   CBC with Differential/Platelet   CMP14+EGFR       Patient's breathing and everything is doing well.  She seems to be doing well with anxiety and depression even without the Vraylar for now per her.  Will have her come in and do some hospital follow-up blood work and then follow-up in 3 months. Follow up plan: Return in about 3 months (around 03/11/2023), or if symptoms worsen or fail to improve, for depression recheck .     I discussed the assessment and treatment plan with the patient. The patient was provided an opportunity to ask questions and all were answered. The patient agreed with the plan and demonstrated an understanding of the instructions.   The patient was advised to call back or seek an in-person evaluation if the symptoms worsen or if the condition fails to improve as anticipated.  The above assessment and management plan was discussed with the patient. The patient verbalized understanding of and has agreed to the management plan. Patient is aware to call the clinic if symptoms persist or worsen. Patient is aware when to return to the clinic for a follow-up visit. Patient educated on when it is appropriate to go to the emergency department.    I provided 12 minutes of non-face-to-face time during this encounter.    Nils Pyle, MD

## 2022-12-12 ENCOUNTER — Other Ambulatory Visit: Payer: BC Managed Care – PPO

## 2022-12-12 DIAGNOSIS — J189 Pneumonia, unspecified organism: Secondary | ICD-10-CM

## 2022-12-12 DIAGNOSIS — E876 Hypokalemia: Secondary | ICD-10-CM

## 2022-12-12 LAB — CBC WITH DIFFERENTIAL/PLATELET
Basophils Absolute: 0.1 10*3/uL (ref 0.0–0.2)
Basos: 1 %
EOS (ABSOLUTE): 0.3 10*3/uL (ref 0.0–0.4)
Eos: 4 %
Hematocrit: 38.6 % (ref 34.0–46.6)
Hemoglobin: 13 g/dL (ref 11.1–15.9)
Immature Grans (Abs): 0 10*3/uL (ref 0.0–0.1)
Immature Granulocytes: 1 %
Lymphocytes Absolute: 3.1 10*3/uL (ref 0.7–3.1)
Lymphs: 41 %
MCH: 27.9 pg (ref 26.6–33.0)
MCHC: 33.7 g/dL (ref 31.5–35.7)
MCV: 83 fL (ref 79–97)
Monocytes Absolute: 0.5 10*3/uL (ref 0.1–0.9)
Monocytes: 7 %
Neutrophils Absolute: 3.5 10*3/uL (ref 1.4–7.0)
Neutrophils: 46 %
Platelets: 374 10*3/uL (ref 150–450)
RBC: 4.66 x10E6/uL (ref 3.77–5.28)
RDW: 14.1 % (ref 11.7–15.4)
WBC: 7.5 10*3/uL (ref 3.4–10.8)

## 2022-12-12 LAB — CMP14+EGFR
ALT: 22 IU/L (ref 0–32)
AST: 19 IU/L (ref 0–40)
Albumin: 3.9 g/dL — ABNORMAL LOW (ref 4.0–5.0)
Alkaline Phosphatase: 63 IU/L (ref 44–121)
BUN/Creatinine Ratio: 8 — ABNORMAL LOW (ref 9–23)
BUN: 7 mg/dL (ref 6–20)
Bilirubin Total: 0.4 mg/dL (ref 0.0–1.2)
CO2: 19 mmol/L — ABNORMAL LOW (ref 20–29)
Chloride: 106 mmol/L (ref 96–106)
Creatinine, Ser: 0.86 mg/dL (ref 0.57–1.00)
Globulin, Total: 2.3 g/dL (ref 1.5–4.5)
Glucose: 100 mg/dL — ABNORMAL HIGH (ref 70–99)
Potassium: 4.3 mmol/L (ref 3.5–5.2)
Sodium: 139 mmol/L (ref 134–144)
Total Protein: 6.2 g/dL (ref 6.0–8.5)
eGFR: 95 mL/min/{1.73_m2} (ref >59)

## 2023-01-02 ENCOUNTER — Telehealth: Payer: Self-pay | Admitting: Family Medicine

## 2023-01-02 DIAGNOSIS — Z9109 Other allergy status, other than to drugs and biological substances: Secondary | ICD-10-CM

## 2023-01-02 DIAGNOSIS — J4540 Moderate persistent asthma, uncomplicated: Secondary | ICD-10-CM

## 2023-01-02 MED ORDER — MONTELUKAST SODIUM 10 MG PO TABS
10.0000 mg | ORAL_TABLET | Freq: Every day | ORAL | 1 refills | Status: DC
Start: 2023-01-02 — End: 2023-07-03

## 2023-01-02 NOTE — Telephone Encounter (Signed)
LMOVM refill sent to pharmacy 

## 2023-01-02 NOTE — Telephone Encounter (Signed)
  Prescription Request  01/02/2023  Is this a "Controlled Substance" medicine? no  Have you seen your PCP in the last 2 weeks? yes  If YES, route message to pool  -  If NO, patient needs to be scheduled for appointment.  What is the name of the medication or equipment? Singulair 10 mg  Have you contacted your pharmacy to request a refill? yes    Which pharmacy would you like this sent to? [facility] (patient has switched to this pharmacy - please update her records to reflect this)   Patient notified that their request is being sent to the clinical staff for review and that they should receive a response within 2 business days.

## 2023-03-13 ENCOUNTER — Ambulatory Visit: Payer: BC Managed Care – PPO | Admitting: Family Medicine

## 2023-03-31 ENCOUNTER — Ambulatory Visit: Payer: BC Managed Care – PPO | Admitting: Family Medicine

## 2023-04-01 ENCOUNTER — Other Ambulatory Visit: Payer: Self-pay | Admitting: Family Medicine

## 2023-04-01 DIAGNOSIS — F418 Other specified anxiety disorders: Secondary | ICD-10-CM

## 2023-04-01 DIAGNOSIS — F3181 Bipolar II disorder: Secondary | ICD-10-CM

## 2023-04-25 ENCOUNTER — Encounter: Payer: Self-pay | Admitting: Family Medicine

## 2023-04-26 ENCOUNTER — Ambulatory Visit: Payer: Self-pay | Admitting: Family Medicine

## 2023-04-26 VITALS — BP 121/82 | HR 85 | Temp 97.3°F | Ht 62.0 in | Wt 250.0 lb

## 2023-04-26 DIAGNOSIS — J45901 Unspecified asthma with (acute) exacerbation: Secondary | ICD-10-CM

## 2023-04-26 DIAGNOSIS — R051 Acute cough: Secondary | ICD-10-CM

## 2023-04-26 MED ORDER — AZITHROMYCIN 250 MG PO TABS: ORAL_TABLET | ORAL | 0 refills | Status: DC

## 2023-04-26 MED ORDER — BENZONATATE 200 MG PO CAPS: 200.0000 mg | ORAL_CAPSULE | Freq: Three times a day (TID) | ORAL | 0 refills | Status: DC | PRN

## 2023-04-26 NOTE — Progress Notes (Signed)
Chief Complaint  Patient presents with   Cough   Nasal Congestion    HPI  Patient presents today for Patient presents with upper respiratory congestion. Rhinorrhea that is frequently purulent. There is no sore throat. Patient reports coughing frequently as well.  yellow sputum noted. There is no fever, chills or sweats. The patient reports mid shortnessof breath. Hx of childhood asthma. Onset was 3-5 days ago. Gradually worsening. Tried OTCs without improvement.  PMH: Smoking status noted ROS: Per HPI  Objective: BP 121/82   Pulse 85   Temp (!) 97.3 F (36.3 C)   Ht 5\' 2"  (1.575 m)   Wt 250 lb (113.4 kg)   SpO2 97%   BMI 45.73 kg/m  Gen: NAD, alert, cooperative with exam HEENT: NCAT, Nasal passages swollen, red TMS RED CV: RRR, good S1/S2, no murmur Resp: Bronchitis changes with scattered wheezes, non-labored Ext: No edema, warm Neuro: Alert and oriented, No gross deficits  Assessment and plan:  1. Asthmatic bronchitis with acute exacerbation, unspecified asthma severity, unspecified whether persistent   2. Acute cough     Meds ordered this encounter  Medications   azithromycin (ZITHROMAX Z-PAK) 250 MG tablet    Sig: Take two right away Then one a day for the next 4 days.    Dispense:  6 each    Refill:  0   benzonatate (TESSALON) 200 MG capsule    Sig: Take 1 capsule (200 mg total) by mouth 3 (three) times daily as needed for cough.    Dispense:  20 capsule    Refill:  0    Orders Placed This Encounter  Procedures   COVID-19, Flu A+B and RSV    Order Specific Question:   Previously tested for COVID-19    Answer:   Unknown    Order Specific Question:   Resident in a congregate (group) care setting    Answer:   Unknown    Order Specific Question:   Is the patient student?    Answer:   No    Order Specific Question:   Employed in healthcare setting    Answer:   Unknown    Order Specific Question:   Pregnant    Answer:   Unknown    Order Specific Question:    Has patient completed COVID vaccination(s) (2 doses of Pfizer/Moderna 1 dose of [employer])    Answer:   Unknown    Order Specific Question:   Release to patient    Answer:   Immediate [1]    Follow up as needed.  Mechele Claude, MD

## 2023-04-27 ENCOUNTER — Encounter: Payer: Self-pay | Admitting: Family Medicine

## 2023-04-27 ENCOUNTER — Telehealth: Payer: Self-pay

## 2023-04-27 LAB — COVID-19, FLU A+B AND RSV
Influenza A, NAA: NOT DETECTED
Influenza B, NAA: NOT DETECTED
RSV, NAA: NOT DETECTED
SARS-CoV-2, NAA: NOT DETECTED

## 2023-04-27 NOTE — Telephone Encounter (Signed)
Copied from CRM [id number]. Topic: General - Other >> Apr 27, 2023  3:48 PM Sasha H wrote: Reason for CRM: pt would like a call regarding note for work from yesterdays (12/11) appt.

## 2023-04-27 NOTE — Telephone Encounter (Signed)
See other encounter.

## 2023-05-04 ENCOUNTER — Ambulatory Visit: Payer: Medicaid Other | Admitting: Family Medicine

## 2023-05-23 IMAGING — DX DG KNEE 1-2V*R*
2 series · 2 of 2 positions shown · non-contrast
Comparison: None.

CLINICAL DATA: Status post fall.

EXAM:
RIGHT KNEE - 1-2 VIEW

[knee ap]
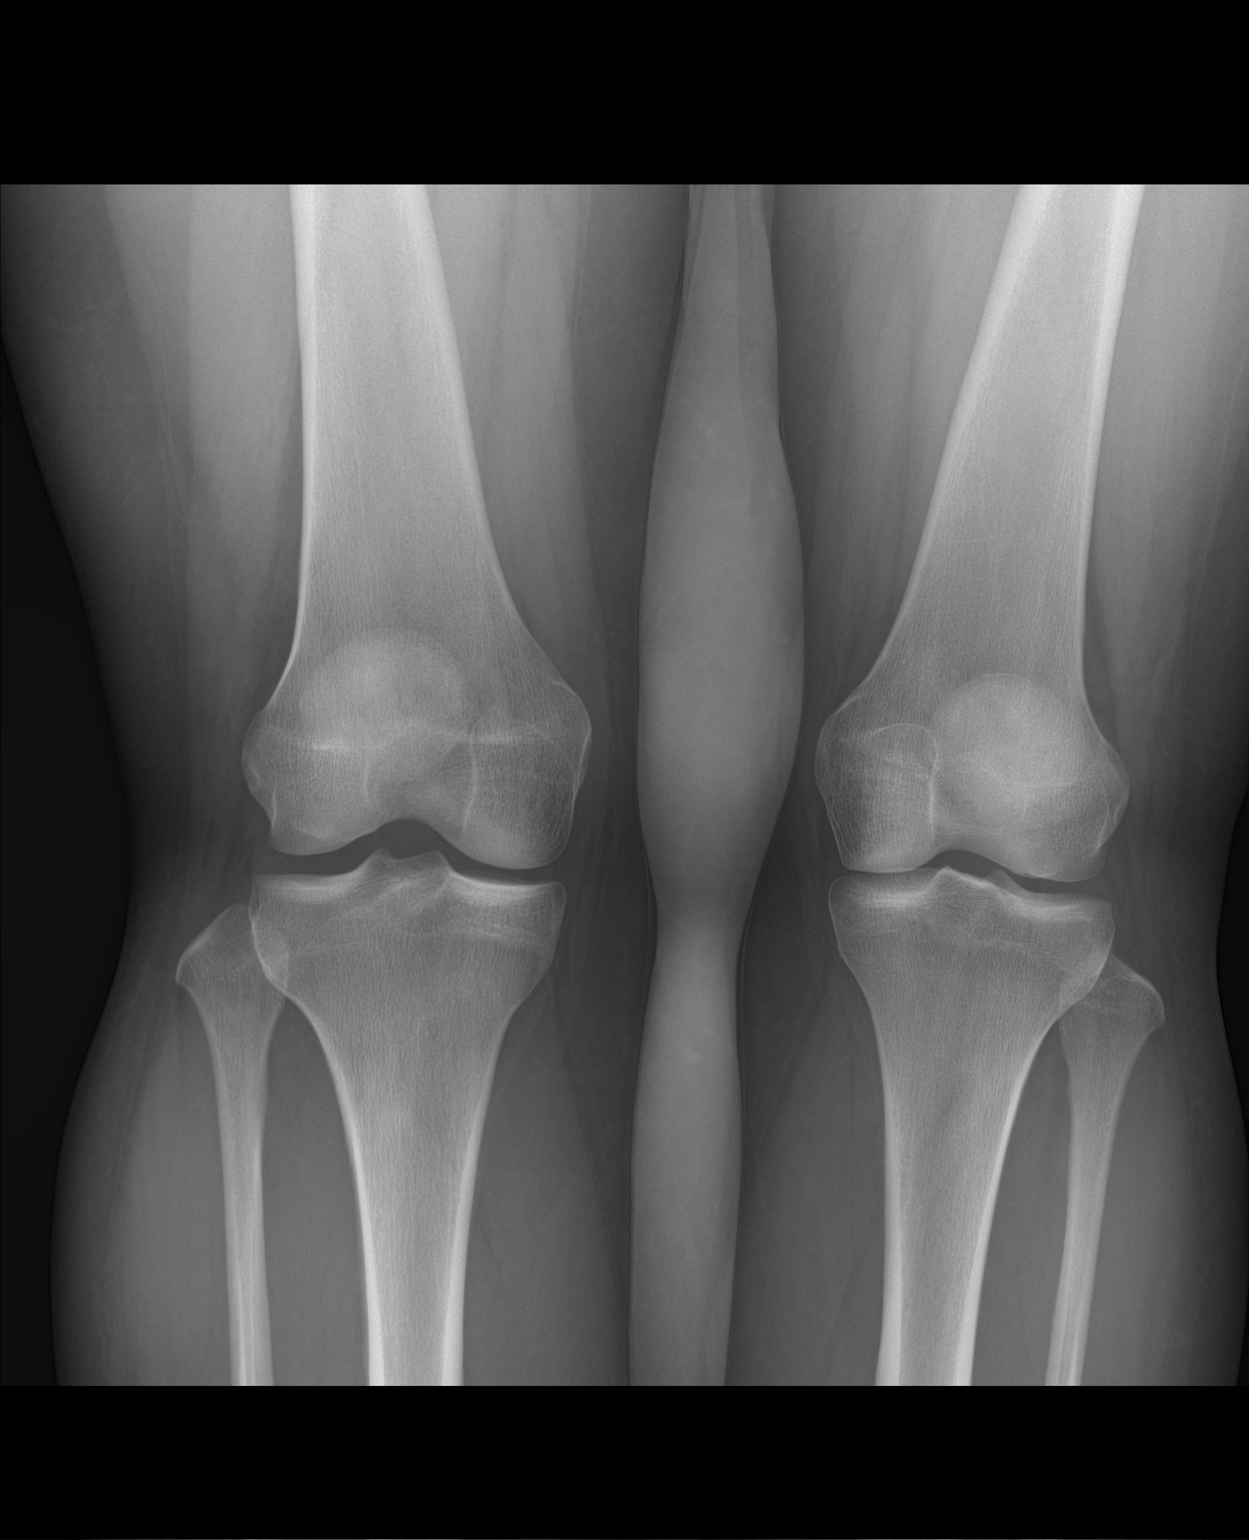

[knee lat]
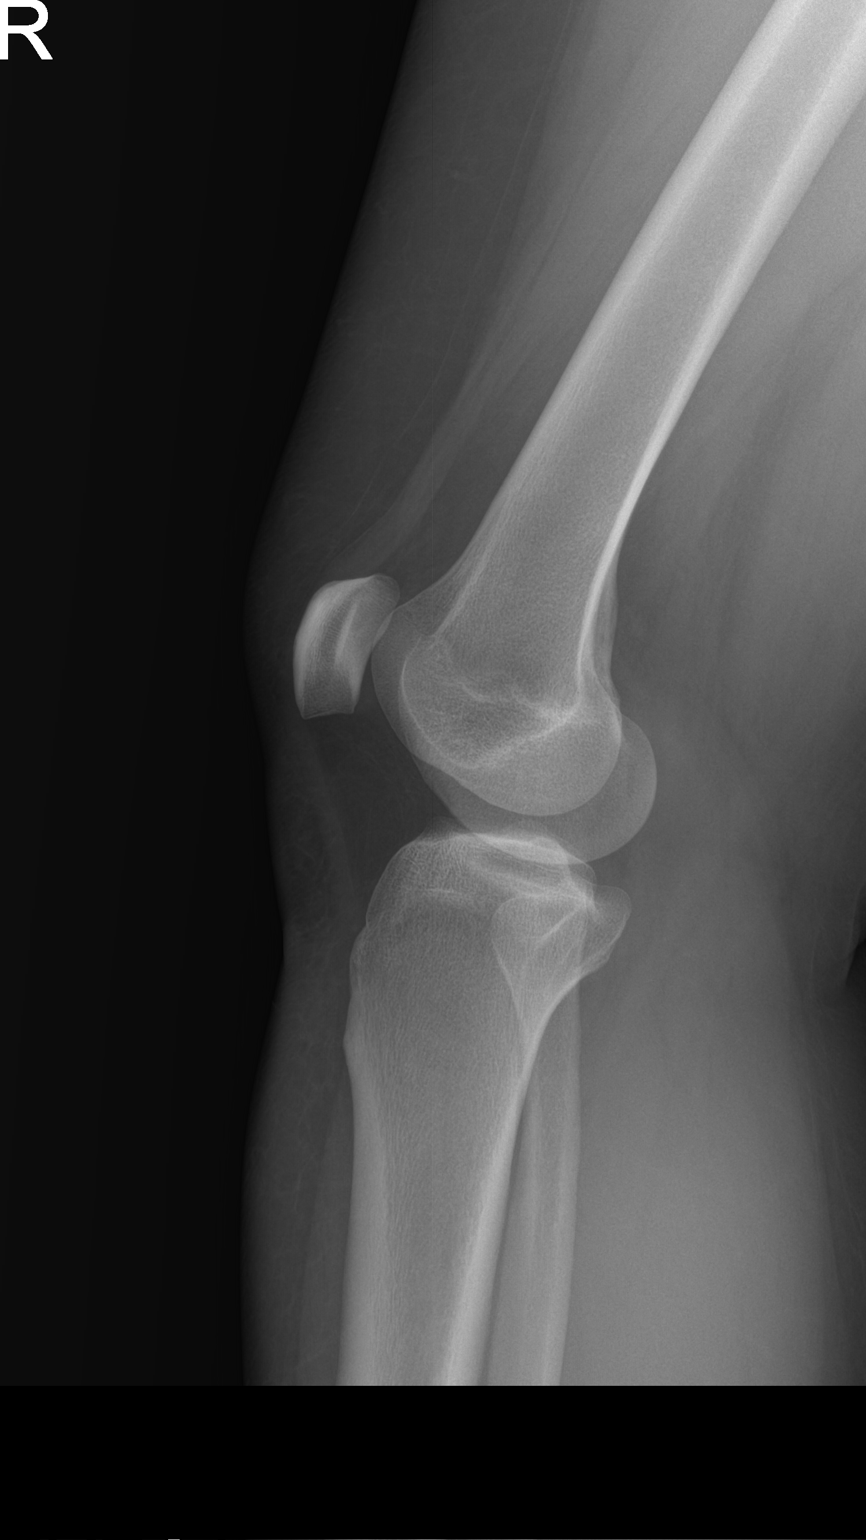

[2 of 2 positions shown; findings below may reference images not displayed]

FINDINGS: No evidence of fracture, dislocation, or joint effusion. No evidence
of arthropathy or other focal bone abnormality. Soft tissues are
unremarkable.
IMPRESSION: Negative.

## 2023-05-25 ENCOUNTER — Encounter: Payer: Self-pay | Admitting: Nurse Practitioner

## 2023-05-25 ENCOUNTER — Ambulatory Visit: Payer: Medicaid Other | Admitting: Nurse Practitioner

## 2023-05-25 ENCOUNTER — Ambulatory Visit (INDEPENDENT_AMBULATORY_CARE_PROVIDER_SITE_OTHER): Payer: Medicaid Other

## 2023-05-25 VITALS — BP 128/79 | HR 79 | Temp 97.6°F | Ht 62.0 in | Wt 251.2 lb

## 2023-05-25 DIAGNOSIS — M25531 Pain in right wrist: Secondary | ICD-10-CM

## 2023-05-25 MED ORDER — PREDNISONE 10 MG (21) PO TBPK: ORAL_TABLET | ORAL | 0 refills | Status: DC

## 2023-05-25 NOTE — Progress Notes (Signed)
 Acute Office Visit  Subjective:     Patient ID: Nichole Guerra, female    DOB: [birth year], 28 y.o.   MRN: [id number]  Chief Complaint  Patient presents with   Wrist Pain    Has knot on right wrist since before christmas. Painful, losing strength in wrist, getting worse. Thinks she may have hurt it when lifting heavy shelf    Nichole Guerra 28 year old female present May 25, 2023 for an acute visit concern for right wrist pain.  No other concern Wrist Pain: Patient complaints of right wrist pain. This is evaluated as a personal injury. The pain began 3 months ago I picked a 5 layer shelves and it was very heavy, I guess I just pulled something that what it felt like  The pain is located primarily in the ulnar area.  She describes the symptoms as aching. Symptoms improve with heat, ice. The symptoms are worse with flexion, extension, and movement. The patient  does not have neck pain. The patient is active in none and use to work at [employer] when that happen . Treatment to date has been ice, heat, with significant relief.  Active Ambulatory Problems    Diagnosis Date Noted   Asthma, moderate persistent 02/02/2017   Environmental allergies 02/02/2017   Food allergy  02/02/2017   Depression with anxiety 02/02/2017   Obesity (BMI 35.0-39.9 without comorbidity) 02/02/2017   Bipolar 2 disorder (HCC) 07/05/2018   Irregular periods 12/21/2020   Panic disorder 01/12/2021   CAP (community acquired pneumonia) 11/26/2022   Acute respiratory failure with hypoxia (HCC) 11/26/2022   Prolonged QT interval 11/26/2022   Elevated d-dimer 11/26/2022   Asthma, chronic 11/26/2022   Obesity, Class III, BMI 40-49.9 (morbid obesity) (HCC) 11/26/2022   Hypoalbuminemia due to protein-calorie malnutrition (HCC) 11/26/2022   Lobar pneumonia (HCC) 11/26/2022   Resolved Ambulatory Problems    Diagnosis Date Noted   Contraceptive management 12/17/2012   URI with cough and congestion 10/26/2020   Screening  examination for STD (sexually transmitted disease) 12/21/2020   Pregnancy examination or test, negative result 12/21/2020   Oral yeast infection 01/05/2021   Exposure to COVID-19 virus 05/13/2021   Past Medical History:  Diagnosis Date   Allergy     Anxiety    Asthma    Cardiac arrest (HCC)    Depression     Review of Systems  Constitutional:  Negative for chills and fever.  HENT:  Negative for sore throat.   Respiratory:  Negative for cough, shortness of breath, wheezing and stridor.   Cardiovascular:  Negative for chest pain and leg swelling.  Gastrointestinal:  Negative for constipation, diarrhea, nausea and vomiting.  Musculoskeletal:  Positive for joint pain.       Right wrist  Neurological:  Negative for dizziness and headaches.   Negative unless indicated in HPI    Objective:    BP 128/79   Pulse 79   Temp 97.6 F (36.4 C) (Temporal)   Ht 5' 2 (1.575 m)   Wt 251 lb 3.2 oz (113.9 kg)   SpO2 96%   BMI 45.95 kg/m  BP Readings from Last 3 Encounters:  05/25/23 128/79  04/26/23 121/82  11/27/22 114/74   Wt Readings from Last 3 Encounters:  05/25/23 251 lb 3.2 oz (113.9 kg)  04/26/23 250 lb (113.4 kg)  11/26/22 245 lb (111.1 kg)      Physical Exam Vitals and nursing note reviewed.  Constitutional:      Appearance: She is obese.  HENT:  Head: Normocephalic and atraumatic.     Nose: Nose normal.     Mouth/Throat:     Mouth: Mucous membranes are moist.  Eyes:     General: No scleral icterus.    Extraocular Movements: Extraocular movements intact.     Conjunctiva/sclera: Conjunctivae normal.     Pupils: Pupils are equal, round, and reactive to light.  Cardiovascular:     Rate and Rhythm: Normal rate and regular rhythm.  Pulmonary:     Effort: Pulmonary effort is normal.     Breath sounds: Normal breath sounds.  Musculoskeletal:     Right wrist: Tenderness present. No bony tenderness or crepitus. Normal pulse.     Left wrist: Normal.  Skin:     General: Skin is warm and dry.     Findings: No rash.  Neurological:     Mental Status: She is alert and oriented to person, place, and time.  Psychiatric:        Mood and Affect: Mood normal.        Behavior: Behavior normal.        Thought Content: Thought content normal.        Judgment: Judgment normal.     No results found for any visits on 05/25/23.      Assessment & Plan:  Right wrist pain -     DG Wrist Complete Right -     predniSONE ; As directed x 6 days  Dispense: 21 tablet; Refill: 0   Anxious 28 year old Caucasian female seen today for right wrist pain, no acute distress Right wrist pain we will treat with 6 days of prednisone  and spica brace provided Continue heat for 15 minutes as needed  Encourage healthy lifestyle choices, including diet (rich in fruits, vegetables, and lean proteins, and low in salt and simple carbohydrates) and exercise (at least 30 minutes of moderate physical activity daily).     The above assessment and management plan was discussed with the patient. The patient verbalized understanding of and has agreed to the management plan. Patient is aware to call the clinic if they develop any new symptoms or if symptoms persist or worsen. Patient is aware when to return to the clinic for a follow-up visit. Patient educated on when it is appropriate to go to the emergency department.  Return if symptoms worsen or fail to improve.  Nichole Gaut [place] Thompson, DNP [facility] [address] [place], [place] [address] [phone]  Note: This document was prepared by [employer] voice dictation technology and any errors that results from this process are unintentional.

## 2023-06-14 ENCOUNTER — Ambulatory Visit: Payer: Medicaid Other | Admitting: Family Medicine

## 2023-06-15 ENCOUNTER — Encounter: Payer: Self-pay | Admitting: Family Medicine

## 2023-07-03 ENCOUNTER — Other Ambulatory Visit: Payer: Self-pay | Admitting: Family Medicine

## 2023-07-03 DIAGNOSIS — F418 Other specified anxiety disorders: Secondary | ICD-10-CM

## 2023-07-03 DIAGNOSIS — J4540 Moderate persistent asthma, uncomplicated: Secondary | ICD-10-CM

## 2023-07-03 DIAGNOSIS — F3181 Bipolar II disorder: Secondary | ICD-10-CM

## 2023-07-03 DIAGNOSIS — Z9109 Other allergy status, other than to drugs and biological substances: Secondary | ICD-10-CM

## 2023-07-03 MED ORDER — SERTRALINE HCL 100 MG PO TABS
100.0000 mg | ORAL_TABLET | Freq: Every day | ORAL | 0 refills | Status: DC
Start: 2023-07-03 — End: 2023-07-17

## 2023-07-03 NOTE — Telephone Encounter (Signed)
 Copied from CRM [id number]. Topic: Clinical - Medication Refill >> Jul 03, 2023 12:23 PM Fuller Mandril wrote: Most Recent Primary Care Visit:  Provider: [facility] [place], [place]  Department: [facility]  Visit Type: ACUTE  Date: 05/25/2023  Medication: sertraline (ZOLOFT) 100 MG tablet   Has the patient contacted their pharmacy? No (Agent: If no, request that the patient contact the pharmacy for the refill. If patient does not wish to contact the pharmacy document the reason why and proceed with request.) (Agent: If yes, when and what did the pharmacy advise?)  Is this the correct pharmacy for this prescription? Yes If no, delete pharmacy and type the correct one.  This is the patient's preferred pharmacy:  [facility] - [place], [place] - [address] [place] [address] Phone: [phone] Fax: [phone]   Has the prescription been filled recently? No  Is the patient out of the medication? Yes  Has the patient been seen for an appointment in the last year OR does the patient have an upcoming appointment? Yes rescheduled for 1/9 and 3/3  Can we respond through MyChart? Yes  Agent: Please be advised that Rx refills may take up to 3 business days. We ask that you follow-up with your pharmacy.

## 2023-07-17 ENCOUNTER — Ambulatory Visit: Payer: Medicaid Other | Admitting: Family Medicine

## 2023-07-17 VITALS — BP 121/72 | HR 79 | Ht 62.0 in | Wt 250.0 lb

## 2023-07-17 DIAGNOSIS — F418 Other specified anxiety disorders: Secondary | ICD-10-CM

## 2023-07-17 DIAGNOSIS — F410 Panic disorder [episodic paroxysmal anxiety] without agoraphobia: Secondary | ICD-10-CM

## 2023-07-17 DIAGNOSIS — F3181 Bipolar II disorder: Secondary | ICD-10-CM | POA: Diagnosis not present

## 2023-07-17 DIAGNOSIS — Z23 Encounter for immunization: Secondary | ICD-10-CM

## 2023-07-17 MED ORDER — SERTRALINE HCL 100 MG PO TABS
100.0000 mg | ORAL_TABLET | Freq: Every day | ORAL | 1 refills | Status: DC
Start: 2023-07-17 — End: 2023-11-02

## 2023-07-17 MED ORDER — CARIPRAZINE HCL 1.5 MG PO CAPS
1.5000 mg | ORAL_CAPSULE | ORAL | 1 refills | Status: DC
Start: 2023-07-17 — End: 2023-08-14

## 2023-07-17 NOTE — Progress Notes (Signed)
 BP 121/72   Pulse 79   Ht 5\' 2"  (1.575 m)   Wt 250 lb (113.4 kg)   SpO2 97%   BMI 45.73 kg/m    Subjective:   Patient ID: Nichole Guerra, female    DOB: 1995/07/20, 28 y.o.   MRN: [id number]  HPI: Nichole Guerra is a 28 y.o. female presenting on 07/17/2023 for Medical Management of Chronic Issues and Depression   HPI Depression and anxiety recheck and bipolar 2 Patient is coming in today for depression and anxiety and possible bipolar 2 recheck, she currently takes Zoloft.  She is not doing as well right now and she feels that she gets 8 for irritable and easily anger and feels anxious.  It affected her sleep some to with her not being able to sleep as much.  Patient denies any suicidal ideations or thoughts of hurting herself.  She says some of her stressors are from her boyfriend's health and him not wanting to go take care of himself.    07/17/2023    9:58 AM 04/26/2023    4:01 PM 04/26/2023    3:57 PM 07/22/2022   11:24 AM 04/01/2022    9:07 AM  Depression screen PHQ 2/9  Decreased Interest 2 2 0 2 0  Down, Depressed, Hopeless 2 2 0 1 0  PHQ - 2 Score 4 4 0 3 0  Altered sleeping 3 3  3 2   Tired, decreased energy 2 3  1 1   Change in appetite 2 3  1  0  Feeling bad or failure about yourself  1 2  1  0  Trouble concentrating 2 2  1  0  Moving slowly or fidgety/restless 2 1  1  0  Suicidal thoughts 0 0  0 0  PHQ-9 Score 16 18  11 3   Difficult doing work/chores Somewhat difficult Somewhat difficult  Somewhat difficult Somewhat difficult     Relevant past medical, surgical, family and social history reviewed and updated as indicated. Interim medical history since our last visit reviewed. Allergies and medications reviewed and updated.  Review of Systems  Constitutional:  Negative for chills and fever.  Eyes:  Negative for redness and visual disturbance.  Respiratory:  Negative for chest tightness and shortness of breath.   Cardiovascular:  Negative for chest pain and leg swelling.   Genitourinary:  Negative for difficulty urinating and dysuria.  Musculoskeletal:  Negative for back pain and gait problem.  Skin:  Negative for rash.  Neurological:  Negative for dizziness, light-headedness and headaches.  Psychiatric/Behavioral:  Positive for dysphoric mood and sleep disturbance. Negative for agitation, behavioral problems, self-injury and suicidal ideas. The patient is nervous/anxious.   All other systems reviewed and are negative.   Per HPI unless specifically indicated above   Allergies as of 07/17/2023       Reactions   Penicillins Anaphylaxis   Throat swells up.   Shellfish-derived Products Anaphylaxis   Bactrim [sulfamethoxazole-trimethoprim] Hives   Hydrocodone Hives   Yellow Dyes (non-tartrazine) Rash        Medication List        Accurate as of July 17, 2023 10:07 AM. If you have any questions, ask your nurse or doctor.          STOP taking these medications    azithromycin 250 MG tablet Commonly known as: Zithromax Z-Pak Stopped by: Elige Radon Kien Mirsky   benzonatate 200 MG capsule Commonly known as: TESSALON Stopped by: Elige Radon Arlicia Paquette   ondansetron 4 MG disintegrating tablet  Commonly known as: ZOFRAN-ODT Stopped by: Elige Radon Breslyn Abdo   predniSONE 10 MG (21) Tbpk tablet Commonly known as: STERAPRED UNI-PAK 21 TAB Stopped by: Ivin Booty A Shanaiya Bene       TAKE these medications    albuterol 108 (90 Base) MCG/ACT inhaler Commonly known as: VENTOLIN HFA Inhale 2 puffs into the lungs every 6 (six) hours as needed for wheezing or shortness of breath. What changed: Another medication with the same name was removed. Continue taking this medication, and follow the directions you see here. Changed by: Elige Radon Jasmane Brockway   cariprazine 1.5 MG capsule Commonly known as: Vraylar Take 1 capsule (1.5 mg total) by mouth daily. Started by: Elige Radon Rowena Moilanen   EpiPen 2-Pak 0.3 MG/0.3ML Soaj injection Generic drug: EPINEPHrine Inject 0.3  mg into the muscle as needed for anaphylaxis.   fexofenadine 180 MG tablet Commonly known as: ALLEGRA Take 1 tablet (180 mg total) by mouth 2 (two) times daily as needed for allergies or rhinitis.   fluticasone furoate-vilanterol 100-25 MCG/ACT Aepb Commonly known as: BREO ELLIPTA Inhale 1 puff into the lungs daily.   montelukast 10 MG tablet Commonly known as: SINGULAIR TAKE 1 TABLET BY MOUTH AT BEDTIME   [name] 665-25 MCG/ACT Susp Generic drug: Olopatadine-Mometasone Place 2 sprays into the nose 2 (two) times daily as needed.   sertraline 100 MG tablet Commonly known as: ZOLOFT Take 1 tablet (100 mg total) by mouth daily.         Objective:   BP 121/72   Pulse 79   Ht 5\' 2"  (1.575 m)   Wt 250 lb (113.4 kg)   SpO2 97%   BMI 45.73 kg/m   Wt Readings from Last 3 Encounters:  07/17/23 250 lb (113.4 kg)  05/25/23 251 lb 3.2 oz (113.9 kg)  04/26/23 250 lb (113.4 kg)    Physical Exam Vitals and nursing note reviewed.  Constitutional:      General: She is not in acute distress.    Appearance: She is well-developed. She is not diaphoretic.  Eyes:     Conjunctiva/sclera: Conjunctivae normal.  Cardiovascular:     Rate and Rhythm: Normal rate and regular rhythm.     Heart sounds: Normal heart sounds. No murmur heard. Pulmonary:     Effort: Pulmonary effort is normal. No respiratory distress.     Breath sounds: Normal breath sounds. No wheezing.  Musculoskeletal:        General: No swelling.  Skin:    General: Skin is warm and dry.     Findings: No rash.  Neurological:     Mental Status: She is alert and oriented to person, place, and time.     Coordination: Coordination normal.  Psychiatric:        Mood and Affect: Mood is anxious and depressed.        Behavior: Behavior normal.        Thought Content: Thought content does not include suicidal ideation. Thought content does not include suicidal plan.       Assessment & Plan:   Problem List Items  Addressed This Visit       Other   Depression with anxiety - Primary   Relevant Medications   sertraline (ZOLOFT) 100 MG tablet   cariprazine (VRAYLAR) 1.5 MG capsule   Bipolar 2 disorder (HCC)   Relevant Medications   sertraline (ZOLOFT) 100 MG tablet   cariprazine (VRAYLAR) 1.5 MG capsule   Panic disorder   Relevant Medications   sertraline (ZOLOFT) 100  MG tablet   cariprazine (VRAYLAR) 1.5 MG capsule    Will start Vraylar, continue Zoloft in addition, will see back in a month or 2 to see how she is doing. Follow up plan: Return if symptoms worsen or fail to improve, for 1 to 90-month anxiety and depression recheck.  Counseling provided for all of the vaccine components No orders of the defined types were placed in this encounter.   Arville Care, MD [facility] 07/17/2023, 10:07 AM

## 2023-08-14 ENCOUNTER — Telehealth: Payer: Self-pay | Admitting: Allergy & Immunology

## 2023-08-14 ENCOUNTER — Encounter: Payer: Self-pay | Admitting: Family Medicine

## 2023-08-14 ENCOUNTER — Ambulatory Visit: Admitting: Family Medicine

## 2023-08-14 VITALS — BP 125/79 | HR 78 | Ht 62.0 in | Wt 250.0 lb

## 2023-08-14 DIAGNOSIS — F418 Other specified anxiety disorders: Secondary | ICD-10-CM

## 2023-08-14 DIAGNOSIS — F3181 Bipolar II disorder: Secondary | ICD-10-CM

## 2023-08-14 DIAGNOSIS — F410 Panic disorder [episodic paroxysmal anxiety] without agoraphobia: Secondary | ICD-10-CM | POA: Diagnosis not present

## 2023-08-14 MED ORDER — CARIPRAZINE HCL 1.5 MG PO CAPS
1.5000 mg | ORAL_CAPSULE | ORAL | 1 refills | Status: DC
Start: 2023-08-14 — End: 2023-11-30

## 2023-08-14 NOTE — Telephone Encounter (Signed)
 Pt is requesting a refill for ryaltriss, pt has appt schedule June 9.

## 2023-08-14 NOTE — Telephone Encounter (Signed)
 Please advise if okay to send ryaltris. Patient was last seen July 2023. Has an upcoming appt in June 2025.

## 2023-08-14 NOTE — Progress Notes (Signed)
 BP 125/79   Pulse 78   Ht 5\' 2"  (1.575 m)   Wt 250 lb (113.4 kg)   SpO2 97%   BMI 45.73 kg/m    Subjective:   Patient ID: Nichole Guerra, female    DOB: 12/06/1995, 28 y.o.   MRN: [id number]  HPI: Nichole Guerra is a 28 y.o. female presenting on 08/14/2023 for Medical Management of Chronic Issues, Depression, and Anxiety   HPI Depression and anxiety and bipolar 2 Patient is coming in today for recheck for depression and anxiety and bipolar 2.  She is currently taking Vraylar and Zoloft.  She feels like things are doing well.  She feels like she is having more motivation and getting up and waking up and ready to go.  She denies any suicidal ideations or thoughts of hurting herself.    08/14/2023    8:37 AM 07/17/2023    9:58 AM 04/26/2023    4:01 PM 04/26/2023    3:57 PM 07/22/2022   11:24 AM  Depression screen PHQ 2/9  Decreased Interest 1 2 2  0 2  Down, Depressed, Hopeless 1 2 2  0 1  PHQ - 2 Score 2 4 4  0 3  Altered sleeping 1 3 3  3   Tired, decreased energy 1 2 3  1   Change in appetite 1 2 3  1   Feeling bad or failure about yourself  0 1 2  1   Trouble concentrating 0 2 2  1   Moving slowly or fidgety/restless 0 2 1  1   Suicidal thoughts 0 0 0  0  PHQ-9 Score 5 16 18  11   Difficult doing work/chores Somewhat difficult Somewhat difficult Somewhat difficult  Somewhat difficult     Relevant past medical, surgical, family and social history reviewed and updated as indicated. Interim medical history since our last visit reviewed. Allergies and medications reviewed and updated.  Review of Systems  Constitutional:  Negative for chills and fever.  Eyes:  Negative for visual disturbance.  Respiratory:  Negative for chest tightness and shortness of breath.   Cardiovascular:  Negative for chest pain and leg swelling.  Genitourinary:  Negative for difficulty urinating and dysuria.  Musculoskeletal:  Negative for back pain and gait problem.  Skin:  Negative for rash.  Neurological:   Negative for dizziness, light-headedness and headaches.  Psychiatric/Behavioral:  Positive for decreased concentration and dysphoric mood. Negative for agitation, behavioral problems, self-injury, sleep disturbance and suicidal ideas. The patient is nervous/anxious.   All other systems reviewed and are negative.   Per HPI unless specifically indicated above   Allergies as of 08/14/2023       Reactions   Penicillins Anaphylaxis   Throat swells up.   Shellfish-derived Products Anaphylaxis   Bactrim [sulfamethoxazole-trimethoprim] Hives   Hydrocodone Hives   Yellow Dyes (non-tartrazine) Rash        Medication List        Accurate as of August 14, 2023  8:48 AM. If you have any questions, ask your nurse or doctor.          albuterol 108 (90 Base) MCG/ACT inhaler Commonly known as: VENTOLIN HFA Inhale 2 puffs into the lungs every 6 (six) hours as needed for wheezing or shortness of breath.   cariprazine 1.5 MG capsule Commonly known as: Vraylar Take 1 capsule (1.5 mg total) by mouth daily.   EpiPen 2-Pak 0.3 MG/0.3ML Soaj injection Generic drug: EPINEPHrine Inject 0.3 mg into the muscle as needed for anaphylaxis.   fexofenadine 180  MG tablet Commonly known as: ALLEGRA Take 1 tablet (180 mg total) by mouth 2 (two) times daily as needed for allergies or rhinitis.   fluticasone furoate-vilanterol 100-25 MCG/ACT Aepb Commonly known as: BREO ELLIPTA Inhale 1 puff into the lungs daily.   montelukast 10 MG tablet Commonly known as: SINGULAIR TAKE 1 TABLET BY MOUTH AT BEDTIME   [name] 665-25 MCG/ACT Susp Generic drug: Olopatadine-Mometasone Place 2 sprays into the nose 2 (two) times daily as needed.   sertraline 100 MG tablet Commonly known as: ZOLOFT Take 1 tablet (100 mg total) by mouth daily.         Objective:   BP 125/79   Pulse 78   Ht 5\' 2"  (1.575 m)   Wt 250 lb (113.4 kg)   SpO2 97%   BMI 45.73 kg/m   Wt Readings from Last 3 Encounters:   08/14/23 250 lb (113.4 kg)  07/17/23 250 lb (113.4 kg)  05/25/23 251 lb 3.2 oz (113.9 kg)    Physical Exam Vitals and nursing note reviewed.  Constitutional:      General: She is not in acute distress.    Appearance: She is well-developed. She is not diaphoretic.  Eyes:     Conjunctiva/sclera: Conjunctivae normal.  Cardiovascular:     Rate and Rhythm: Normal rate and regular rhythm.     Heart sounds: Normal heart sounds. No murmur heard. Pulmonary:     Effort: Pulmonary effort is normal. No respiratory distress.     Breath sounds: Normal breath sounds. No wheezing.  Skin:    General: Skin is warm and dry.     Findings: No rash.  Neurological:     Mental Status: She is alert and oriented to person, place, and time.     Coordination: Coordination normal.  Psychiatric:        Mood and Affect: Mood is anxious and depressed.        Behavior: Behavior normal.        Thought Content: Thought content does not include suicidal ideation. Thought content does not include suicidal plan.       Assessment & Plan:   Problem List Items Addressed This Visit       Other   Depression with anxiety - Primary   Relevant Medications   cariprazine (VRAYLAR) 1.5 MG capsule   Bipolar 2 disorder (HCC)   Relevant Medications   cariprazine (VRAYLAR) 1.5 MG capsule   Panic disorder   Relevant Medications   cariprazine (VRAYLAR) 1.5 MG capsule    Continue current medicine, seems to be doing well.  No change in dose Follow up plan: Return if symptoms worsen or fail to improve, for 2 to 59-month anxiety and depression..  Counseling provided for all of the vaccine components No orders of the defined types were placed in this encounter.   Arville Care, MD [facility] 08/14/2023, 8:48 AM

## 2023-08-16 ENCOUNTER — Other Ambulatory Visit: Payer: Self-pay

## 2023-08-16 ENCOUNTER — Ambulatory Visit: Admitting: Advanced Practice Midwife

## 2023-08-16 ENCOUNTER — Encounter: Payer: Self-pay | Admitting: Advanced Practice Midwife

## 2023-08-16 ENCOUNTER — Other Ambulatory Visit (HOSPITAL_COMMUNITY)
Admission: RE | Admit: 2023-08-16 | Discharge: 2023-08-16 | Disposition: A | Discharge status: Home / Self Care | Source: Ambulatory Visit | Attending: Advanced Practice Midwife | Admitting: Advanced Practice Midwife

## 2023-08-16 VITALS — BP 128/81 | HR 80 | Ht 62.0 in | Wt 250.0 lb

## 2023-08-16 DIAGNOSIS — Z124 Encounter for screening for malignant neoplasm of cervix: Secondary | ICD-10-CM

## 2023-08-16 DIAGNOSIS — Z01419 Encounter for gynecological examination (general) (routine) without abnormal findings: Secondary | ICD-10-CM | POA: Diagnosis not present

## 2023-08-16 MED ORDER — RYALTRIS 665-25 MCG/ACT NA SUSP: 2.0000 | Freq: Two times a day (BID) | NASAL | 0 refills | Status: DC | PRN

## 2023-08-16 MED ORDER — MISOPROSTOL 200 MCG PO TABS: 400.0000 ug | ORAL_TABLET | Freq: Once | ORAL | 0 refills | Status: DC

## 2023-08-16 NOTE — Telephone Encounter (Signed)
 Just one without any refills.  Malachi Bonds, MD [facility]

## 2023-08-16 NOTE — Progress Notes (Signed)
 WELL-WOMAN EXAMINATION Patient name: Nichole Guerra MRN [id number]  Date of birth: 06/14/1995 Chief Complaint:   Gynecologic Exam (Discuss IUD, had sex week ago)  History of Present Illness:   Nichole Guerra is a 28 y.o. G0P0000 Caucasian female being seen today for a routine well-woman exam.  Current complaints: here for routine Pap screening; also interested in IUD for contraception; reports regular, light cycles  PCP: Dr Louanne Skye      does not desire labs Patient's last menstrual period was 08/07/2023. The current method of family planning is none.  Last pap March 2020. Results were: NILM w/ HRHPV not done. H/O abnormal pap: no Last mammogram: never. Results were: N/A. Family h/o breast cancer: no Last colonoscopy: never. Results were: N/A. Family h/o colorectal cancer: no     08/14/2023    8:37 AM 07/17/2023    9:58 AM 04/26/2023    4:01 PM 04/26/2023    3:57 PM 07/22/2022   11:24 AM  Depression screen PHQ 2/9  Decreased Interest 1 2 2  0 2  Down, Depressed, Hopeless 1 2 2  0 1  PHQ - 2 Score 2 4 4  0 3  Altered sleeping 1 3 3  3   Tired, decreased energy 1 2 3  1   Change in appetite 1 2 3  1   Feeling bad or failure about yourself  0 1 2  1   Trouble concentrating 0 2 2  1   Moving slowly or fidgety/restless 0 2 1  1   Suicidal thoughts 0 0 0  0  PHQ-9 Score 5 16 18  11   Difficult doing work/chores Somewhat difficult Somewhat difficult Somewhat difficult  Somewhat difficult        08/14/2023    8:38 AM 07/17/2023    9:59 AM 04/26/2023    4:02 PM 07/22/2022   11:24 AM  GAD 7 : Generalized Anxiety Score  Nervous, Anxious, on Edge 1 3 3 1   Control/stop worrying 2 3 3 2   Worry too much - different things 1 3 2 2   Trouble relaxing 2 2 2 1   Restless 1 2 3 1   Easily annoyed or irritable 3 3 3 1   Afraid - awful might happen 0 1 2 0  Total GAD 7 Score 10 17 18 8   Anxiety Difficulty Somewhat difficult Somewhat difficult Somewhat difficult Somewhat difficult     Review of Systems:    Pertinent items are noted in HPI Denies any headaches, blurred vision, fatigue, shortness of breath, chest pain, abdominal pain, abnormal vaginal discharge/itching/odor/irritation, problems with periods, bowel movements, urination, or intercourse unless otherwise stated above. Pertinent History Reviewed:  Reviewed past medical,surgical, social and family history.  Reviewed problem list, medications and allergies. Physical Assessment:   Vitals:   08/16/23 1122  BP: 128/81  Pulse: 80  Weight: 250 lb (113.4 kg)  Height: 5\' 2"  (1.575 m)  Body mass index is 45.73 kg/m.        Physical Examination:   General appearance - well appearing, and in no distress  Mental status - alert, oriented to person, place, and time  Psych:  She has a normal mood and affect  Skin - warm and dry, normal color, no suspicious lesions noted  Chest - effort normal, all lung fields clear to auscultation bilaterally  Heart - normal rate and regular rhythm  Neck:  midline trachea, no thyromegaly or nodules  Breasts - breasts appear normal, no suspicious masses, no skin or nipple changes or  axillary nodes  Abdomen - soft,  nontender, nondistended, no masses or organomegaly  Pelvic - VULVA: normal appearing vulva with no masses, tenderness or lesions  VAGINA: normal appearing vagina with normal color and discharge, no lesions  CERVIX: normal appearing cervix without discharge or lesions, no CMT  Thin prep pap is done with HR HPV cotesting  UTERUS: uterus is felt to be normal size, shape, consistency and nontender   ADNEXA: No adnexal masses or tenderness noted.  Rectal - not examined  Extremities:  No swelling or varicosities noted  Chaperone: Peggy Dones  No results found for this or any previous visit (from the past 24 hours).  Assessment & Plan:  1) Well-Woman Exam  2) Wants Mirena IUD, R&Bs reviewed of Mirena and Paragard including perf and expulsion; she is most comfortable w the Mirena; hasn't had sex x  1wk> will sched for next wk and instructed no unprotected sex and take cytotec 4hrs prior to insertion  Labs/procedures today: Pap  Mammogram: @ 28yo, or sooner if problems Colonoscopy: @ 28yo, or sooner if problems  No orders of the defined types were placed in this encounter.   Meds:  Meds ordered this encounter  Medications   misoprostol (CYTOTEC) 200 MCG tablet    Sig: Take 2 tablets (400 mcg total) by mouth once for 1 dose. Take 4 hours prior to IUD insertion    Dispense:  2 tablet    Refill:  0    Supervising Provider:   Myna Hidalgo [[id number]]    Follow-up: Return in about 1 week (around 08/23/2023) for Mirena insertion.  Arabella Merles CNM 08/16/2023 12:05 PM

## 2023-08-17 ENCOUNTER — Ambulatory Visit: Payer: BC Managed Care – PPO | Admitting: Adult Health

## 2023-08-17 ENCOUNTER — Telehealth: Payer: Self-pay

## 2023-08-17 LAB — CYTOLOGY - PAP
Chlamydia: NEGATIVE
Comment: NEGATIVE
Comment: NEGATIVE
Comment: NORMAL
Diagnosis: NEGATIVE
High risk HPV: NEGATIVE
Neisseria Gonorrhea: NEGATIVE

## 2023-08-17 NOTE — Telephone Encounter (Signed)
 Pharmacy Patient Advocate Encounter  Received notification from [facility] that Prior Authorization for [name] 665-25MCG/ACT suspension  has been DENIED.  Full denial letter will be uploaded to the media tab. See denial reason below.   Denied. Per the health plan's preferred drug list, at least 2 preferred drugs must be tried before requesting this drug or tell us why the member cannot try any preferred alternatives. Please send [place] supporting chart notes and lab results. Here is list of preferred alternatives: azelastine spray (generic for Astelin); Dymista Nasal Spray; fluticasone spray (generic for Flonase); ipratropium spray (generic for Atrovent Nasal); olopatadine nasal spray (generic for Patanase). Per our records, the member has already tried fluticasone spray (generic for Flonase). Note: Some preferred drug(s) may have quantity limits. Refer to the health plan's preferred drug list for additional details.

## 2023-08-17 NOTE — Telephone Encounter (Signed)
*  Asthma/Allergy  Pharmacy Patient Advocate Encounter   Received notification from CoverMyMeds that prior authorization for [name] 665-25MCG/ACT suspension  is required/requested.   Insurance verification completed.   The patient is insured through [facility] .   Per test claim: PA required; PA submitted to above mentioned insurance via CoverMyMeds Key/confirmation #/EOC [id number] Status is pending

## 2023-08-18 MED ORDER — AZELASTINE-FLUTICASONE 137-50 MCG/ACT NA SUSP: 2.0000 | NASAL | 5 refills | Status: DC

## 2023-08-18 NOTE — Addendum Note (Signed)
 Addended by: Alfonse Spruce on: 08/18/2023 04:35 PM   Modules accepted: Orders

## 2023-08-18 NOTE — Telephone Encounter (Signed)
 I will send in Dymista instead.   Malachi Bonds, MD [facility]

## 2023-08-21 ENCOUNTER — Other Ambulatory Visit (HOSPITAL_COMMUNITY): Payer: Self-pay

## 2023-08-21 ENCOUNTER — Telehealth: Payer: Self-pay

## 2023-08-21 ENCOUNTER — Encounter: Payer: Self-pay | Admitting: Advanced Practice Midwife

## 2023-08-21 NOTE — Telephone Encounter (Signed)
*  Asthma/Allergy  Pharmacy Patient Advocate Encounter   Received notification from CoverMyMeds that prior authorization for Azelastine-Fluticasone 137-50MCG/ACT suspension  is required/requested.   Insurance verification completed.   The patient is insured through [facility] .   Per test claim:  Nichole Guerra is preferred by the insurance.  If suggested medication is appropriate, Please send in a new RX and discontinue this one. If not, please advise as to why it's not appropriate so that we may request a Prior Authorization. Please note, some preferred medications may still require a PA.  If the suggested medications have not been trialed and there are no contraindications to their use, the PA will not be submitted, as it will not be approved.

## 2023-08-24 ENCOUNTER — Ambulatory Visit: Admitting: Obstetrics & Gynecology

## 2023-08-24 VITALS — BP 136/82 | HR 84 | Ht 62.0 in | Wt 247.8 lb

## 2023-08-24 DIAGNOSIS — Z3202 Encounter for pregnancy test, result negative: Secondary | ICD-10-CM | POA: Diagnosis not present

## 2023-08-24 DIAGNOSIS — Z30430 Encounter for insertion of intrauterine contraceptive device: Secondary | ICD-10-CM

## 2023-08-24 DIAGNOSIS — Z3009 Encounter for other general counseling and advice on contraception: Secondary | ICD-10-CM

## 2023-08-24 LAB — POCT URINE PREGNANCY: Preg Test, Ur: NEGATIVE

## 2023-08-24 MED ORDER — LEVONORGESTREL 20 MCG/DAY IU IUD
1.0000 | INTRAUTERINE_SYSTEM | Freq: Once | INTRAUTERINE | Status: AC
Start: 2023-08-24 — End: 2023-08-24
  Administered 2023-08-24: 1 via INTRAUTERINE

## 2023-08-24 NOTE — Progress Notes (Signed)
   GYN VISIT Patient name: Nichole Guerra MRN [id number]  Date of birth: 1996-03-04 Chief Complaint:   Contraception  History of Present Illness:   Nichole Guerra is a 28 y.o. G0P0000 female being seen today for contraceptive management.  Pt going back to school and wants to be on LARC.  Most interested in IUD.   Regular menses lasting for about 3-4 days.  Denies heavy menstrual bleeding or dysmenorrhea.  Denies pelvic or abdominal pain no acute GYN concerns   Patient's last menstrual period was 08/07/2023.    Review of Systems:   Pertinent items are noted in HPI Denies fever/chills, dizziness, headaches, visual disturbances, fatigue, shortness of breath, chest pain, abdominal pain, vomiting, no problems with periods, bowel movements, urination, or intercourse unless otherwise stated above.  Pertinent History Reviewed:  No past surgical history on file.  Past Medical History:  Diagnosis Date   Allergy    Anxiety    Asthma    Cardiac arrest [facility])    Contraceptive management 12/17/2012   Depression    Reviewed problem list, medications and allergies. Physical Assessment:   Vitals:   08/24/23 1335  BP: 136/82  Pulse: 84  Weight: 247 lb 12.8 oz (112.4 kg)  Height: 5\' 2"  (1.575 m)  Body mass index is 45.32 kg/m.       Physical Examination:   General appearance: alert, well appearing, and in no distress  Psych: mood appropriate, normal affect  Skin: warm & dry   Cardiovascular: normal heart rate noted  Respiratory: normal respiratory effort, no distress  Abdomen: soft, non-tender   Pelvic: VULVA: normal appearing vulva with no masses, tenderness or lesions, VAGINA: normal appearing vagina with normal color and discharge, no lesions, CERVIX: normal appearing cervix without discharge or lesions  Extremities: no edema   Chaperone: Faith Rogue    IUD INSERTION   The risks and benefits of the method and placement have been thouroughly reviewed with the patient and all  questions were answered.  Specifically the patient is aware of failure rate of 05/998, expulsion of the IUD and of possible perforation.  The patient is aware of irregular bleeding due to the method and understands the incidence of irregular bleeding diminishes with time.  Signed copy of informed consent in chart.    A sterile speculum was placed in the vagina.  The cervix was visualized, prepped using Betadine, and grasped with a single tooth tenaculum. The uterus was sounded to 7 cm.  Mirena  IUD placed per manufacturer's recommendations. The strings were trimmed to approximately 3 cm. The patient tolerated the procedure well.    Chaperone: Faith Rogue     Assessment & Plan:  1) Mirena IUD insertion - LARCs were reviewed with patient and discussed different types of IUD - Patient proceeded with Mirena - Insertion completed as above -the patient was given post procedure instructions, including signs and symptoms of infection and to check for the strings after each menses or each month, and refraining from intercourse or anything in the vagina for 3 days. She was given a care card with date IUD placed, and date IUD to be removed. She is scheduled for a f/u appointment in  6- 8 weeks.  Orders Placed This Encounter  Procedures   POCT urine pregnancy    Return in about 6 weeks (around 10/05/2023) for 6-8wk IUD check.   Myna Hidalgo, DO Attending Obstetrician & Gynecologist, [facility] for [employer], [facility]

## 2023-10-16 ENCOUNTER — Ambulatory Visit: Admitting: Obstetrics & Gynecology

## 2023-10-23 ENCOUNTER — Ambulatory Visit: Admitting: Allergy & Immunology

## 2023-11-02 ENCOUNTER — Other Ambulatory Visit: Payer: Self-pay | Admitting: Family Medicine

## 2023-11-02 DIAGNOSIS — F410 Panic disorder [episodic paroxysmal anxiety] without agoraphobia: Secondary | ICD-10-CM

## 2023-11-02 DIAGNOSIS — F418 Other specified anxiety disorders: Secondary | ICD-10-CM

## 2023-11-02 DIAGNOSIS — J4540 Moderate persistent asthma, uncomplicated: Secondary | ICD-10-CM

## 2023-11-02 DIAGNOSIS — F3181 Bipolar II disorder: Secondary | ICD-10-CM

## 2023-11-02 MED ORDER — ALBUTEROL SULFATE HFA 108 (90 BASE) MCG/ACT IN AERS: 2.0000 | INHALATION_SPRAY | Freq: Four times a day (QID) | RESPIRATORY_TRACT | 0 refills | Status: DC | PRN

## 2023-11-02 MED ORDER — SERTRALINE HCL 100 MG PO TABS
100.0000 mg | ORAL_TABLET | Freq: Every day | ORAL | 0 refills | Status: DC
Start: 2023-11-02 — End: 2023-11-30

## 2023-11-02 NOTE — Telephone Encounter (Signed)
 Copied from CRM [id number]. Topic: Clinical - Medication Refill >> Nov 02, 2023 12:26 PM Tiffany H wrote: Medication: sertraline  (ZOLOFT ) 100 MG tablet [[id number]] albuterol  (VENTOLIN  HFA) 108 (90 Base) MCG/ACT inhaler [[id number]]   Patient is out of her inhaler. Please expedite.   Has the patient contacted their pharmacy? Yes (Agent: If no, request that the patient contact the pharmacy for the refill. If patient does not wish to contact the pharmacy document the reason why and proceed with request.) (Agent: If yes, when and what did the pharmacy advise?)  This is the patient's preferred pharmacy:  [facility] - [place], [place] - [address] [place] [address] Phone: [phone] Fax: [phone]  Is this the correct pharmacy for this prescription? Yes If no, delete pharmacy and type the correct one.   Has the prescription been filled recently? No  Is the patient out of the medication? Yes  Has the patient been seen for an appointment in the last year OR does the patient have an upcoming appointment? Yes  Can we respond through MyChart? Yes  Agent: Please be advised that Rx refills may take up to 3 business days. We ask that you follow-up with your pharmacy.

## 2023-11-08 ENCOUNTER — Encounter: Payer: Self-pay | Admitting: Allergy & Immunology

## 2023-11-08 ENCOUNTER — Ambulatory Visit (INDEPENDENT_AMBULATORY_CARE_PROVIDER_SITE_OTHER): Admitting: Allergy & Immunology

## 2023-11-08 VITALS — BP 118/86 | HR 96 | Temp 98.3°F | Resp 16 | Ht 63.58 in | Wt 247.4 lb

## 2023-11-08 DIAGNOSIS — T7800XD Anaphylactic reaction due to unspecified food, subsequent encounter: Secondary | ICD-10-CM | POA: Diagnosis not present

## 2023-11-08 DIAGNOSIS — J302 Other seasonal allergic rhinitis: Secondary | ICD-10-CM

## 2023-11-08 DIAGNOSIS — J3089 Other allergic rhinitis: Secondary | ICD-10-CM | POA: Diagnosis not present

## 2023-11-08 DIAGNOSIS — J4540 Moderate persistent asthma, uncomplicated: Secondary | ICD-10-CM

## 2023-11-08 MED ORDER — FLUTICASONE FUROATE-VILANTEROL 100-25 MCG/ACT IN AEPB: 1.0000 | INHALATION_SPRAY | Freq: Every day | RESPIRATORY_TRACT | 3 refills | Status: AC

## 2023-11-08 MED ORDER — EPIPEN 2-PAK 0.3 MG/0.3ML IJ SOAJ: 0.3000 mg | INTRAMUSCULAR | 1 refills | Status: AC | PRN

## 2023-11-08 MED ORDER — MONTELUKAST SODIUM 10 MG PO TABS: 10.0000 mg | ORAL_TABLET | Freq: Every day | ORAL | 1 refills | Status: DC

## 2023-11-08 MED ORDER — AZELASTINE-FLUTICASONE 137-50 MCG/ACT NA SUSP: 2.0000 | NASAL | 5 refills | Status: DC

## 2023-11-08 NOTE — Progress Notes (Signed)
 FOLLOW UP  Date of Service/Encounter:  11/08/23   Assessment:   Moderate persistent asthma, uncomplicated   Seasonal and perennial allergic rhinitis (grasses, ragweed, weeds, trees, indoor molds, outdoor molds, dust mites, cat, dog, cockroach, and hamster, is[place] pig, rabbit)   Anaphylactic shock due to food (shellfish)   Recurrent infections - mostly sinus infections, but one PNA in 2024 (consider immune workup at the next visit)  Plan/Recommendations:   1. Seasonal and perennial allergic rhinitis - Testing at the last visit showed: grasses, ragweed, weeds, trees, indoor molds, outdoor molds, dust mites, cat, dog, cockroach, and hamster, israel pig, rabbit. - Continue with: Singulair  (montelukast ) 10mg  daily - Continue with: Allegra  (fexofenadine ) 180mg  tablet TWICE daily and Dymista  two sprays per nostril 1-2 times daily as needed - You can use an extra dose of the antihistamine, if needed, for breakthrough symptoms.  - Consider nasal saline rinses 1-2 times daily to remove allergens from the nasal cavities as well as help with mucous clearance (this is especially helpful to do before the nasal sprays are given) - We will go ahead and start allergy  shots as a means of long-term control. - Allergy  shots re-train and reset the immune system to ignore environmental allergens and decrease the resulting immune response to those allergens (sneezing, itchy watery eyes, runny nose, nasal congestion, etc).    - Allergy  shots improve symptoms in 75-85% of patients.  - We will schedule you for a rush immunotherapy appointment to get through your vials more quickly and get you to maintenance.   2. Anaphylactic shock due to food (shellfish) - Continue to avoid shellfish. - EpiPen  refilled today.   3. Moderate persistent asthma, uncomplicated - Testing looked great today.  - Daily controller medication(s): Breo 100/37mcg one puff once daily - Prior to physical activity: albuterol  2  puffs 10-15 minutes before physical activity. - Rescue medications: albuterol  4 puffs every 4-6 hours as needed - Asthma control goals:  * Full participation in all desired activities (may need albuterol  before activity) * Albuterol  use two time or less a week on average (not counting use with activity) * Cough interfering with sleep two time or less a month * Oral steroids no more than once a year * No hospitalizations  4. Return in about 3 months (around 02/08/2024). You can have the follow up appointment with Dr. Iva or a Nurse Practicioner (our Nurse Practitioners are excellent and always have Physician oversight!).    Subjective:   Nichole Guerra is a 28 y.o. female presenting today for follow up of  Chief Complaint  Patient presents with   Follow-up    Interested in AIT     Nichole Guerra has a history of the following: Patient Active Problem List   Diagnosis Date Noted   CAP (community acquired pneumonia) 11/26/2022   Acute respiratory failure with hypoxia (HCC) 11/26/2022   Prolonged QT interval 11/26/2022   Elevated d-dimer 11/26/2022   Asthma, chronic 11/26/2022   Obesity, Class III, BMI 40-49.9 (morbid obesity) 11/26/2022   Hypoalbuminemia due to protein-calorie malnutrition (HCC) 11/26/2022   Lobar pneumonia (HCC) 11/26/2022   Panic disorder 01/12/2021   Bipolar 2 disorder (HCC) 07/05/2018   Asthma, moderate persistent 02/02/2017   Environmental allergies 02/02/2017   Food allergy  02/02/2017   Depression with anxiety 02/02/2017   Obesity (BMI 35.0-39.9 without comorbidity) 02/02/2017    History obtained from: chart review and patient.  Discussed the use of AI scribe software for clinical note transcription with the patient and/or  guardian, who gave verbal consent to proceed.  Nichole Guerra is a 28 y.o. female presenting for a follow up visit.  She was last.  She had multiple indoor and outdoor allergens.  We continue with Singulair  and started Allegra  twice daily and  [name] .  We did talk about allergy  shots for long-term control.  She had testing that was positive to all the shellfish.  EpiPen  was provided and reviewed.  Her lung testing looked great.  We continue with Breo 100 mcg 1 puff once daily.  Since the last visit, she has mostly done well.  Asthma/Respiratory Symptom History: Her asthma is generally well-controlled with Breo, which she takes regularly. She has not needed recent refills. She was hospitalized for pneumonia in July of last year, which was her first experience with pneumonia, following a prolonged period of bed rest due to a urinary tract infection. She confirms her asthma is controlled with Breo and has not had COVID.  Allergic Rhinitis Symptom History: She is seeking to schedule allergy  shots to proceed with immunotherapy. Previously, she managed her symptoms with medications but now desires a more long-term solution. She resides in [place] and is currently not working, which may provide flexibility for treatment scheduling. Her current medications include montelukast  and Dymista  nasal spray, as [name]  is not covered by her insurance.  Food Allergy  Symptom History: She avoids shellfish. EpiPen  needs to be refilled.   Infection Symptom History: She experiences sinus infections approximately twice a year, which are managed by her primary care physician. She frequently experiences sinus issues and describes her nose as 'terrible'. No recent ear infections, but sinus infections occur sometimes twice a year. She did have PNA last year once, which was the first time that she has had this.  She has two dogs, a chinchilla, and a lizard. She recently started a new job at [employer] and has plans to pursue education in criminal justice in the future. One of her chinchillas accidentally hung himself after chewing a hole in his hammock. She had had three in total. She is not planning to get another one. She has a [place] her [place].   Otherwise,  there have been no changes to her past medical history, surgical history, family history, or social history.    Review of systems otherwise negative other than that mentioned in the HPI.    Objective:   Blood pressure 118/86, pulse 96, temperature 98.3 F (36.8 C), resp. rate 16, height 5' 3.58 (1.615 m), weight 247 lb 6 oz (112.2 kg), SpO2 95%. Body mass index is 43.02 kg/m.    Physical Exam Vitals reviewed.  Constitutional:      Appearance: She is well-developed.     Comments: Very pleasant.  Cooperative with the exam.  HENT:     Head: Normocephalic and atraumatic.     Right Ear: Tympanic membrane, ear canal and external ear normal. No drainage, swelling or tenderness. Tympanic membrane is not injected, scarred, erythematous, retracted or bulging.     Left Ear: Tympanic membrane, ear canal and external ear normal. No drainage, swelling or tenderness. Tympanic membrane is not injected, scarred, erythematous, retracted or bulging.     Nose: Mucosal edema and rhinorrhea present. No nasal deformity or septal deviation.     Right Turbinates: Enlarged, swollen and pale.     Left Turbinates: Enlarged, swollen and pale.     Right Sinus: No maxillary sinus tenderness or frontal sinus tenderness.     Left Sinus: No maxillary sinus tenderness  or frontal sinus tenderness.     Comments: No nasal polyps.    Mouth/Throat:     Lips: Pink.     Mouth: Mucous membranes are not pale and not dry.     Pharynx: Uvula midline.     Tonsils: 1+ on the right. 1+ on the left.     Comments: Moderate cobblestoning present.   Eyes:     General:        Right eye: No discharge.        Left eye: No discharge.     Conjunctiva/sclera: Conjunctivae normal.     Right eye: Right conjunctiva is not injected. No chemosis.    Left eye: Left conjunctiva is not injected. No chemosis.    Pupils: Pupils are equal, round, and reactive to light.    Cardiovascular:     Rate and Rhythm: Normal rate and regular  rhythm.     Heart sounds: Normal heart sounds.  Pulmonary:     Effort: Pulmonary effort is normal. No tachypnea, accessory muscle usage or respiratory distress.     Breath sounds: Normal breath sounds. No wheezing, rhonchi or rales.     Comments: Moving air well in all lung fields.  No increased work of breathing. Chest:     Chest wall: No tenderness.  Abdominal:     Tenderness: There is no abdominal tenderness. There is no guarding or rebound.  Lymphadenopathy:     Head:     Right side of head: No submandibular, tonsillar or occipital adenopathy.     Left side of head: No submandibular, tonsillar or occipital adenopathy.     Cervical: No cervical adenopathy.   Skin:    Coloration: Skin is not pale.     Findings: No abrasion, erythema, petechiae or rash. Rash is not papular, urticarial or vesicular.   Neurological:     Mental Status: She is alert.   Psychiatric:        Behavior: Behavior is cooperative.      Diagnostic studies:    Spirometry: results normal (FEV1: 2.79/89%, FVC: 3.26/89%, FEV1/FVC: 86%).    Spirometry consistent with normal pattern.   Allergy  Studies: none       Marty Shaggy, MD  [facility] 

## 2023-11-08 NOTE — Patient Instructions (Addendum)
 1. Seasonal and perennial allergic rhinitis - Testing at the last visit showed: grasses, ragweed, weeds, trees, indoor molds, outdoor molds, dust mites, cat, dog, cockroach, and hamster, israel pig, rabbit. - Continue with: Singulair  (montelukast ) 10mg  daily - Continue with: Allegra  (fexofenadine ) 180mg  tablet TWICE daily and Dymista  two sprays per nostril 1-2 times daily as needed - You can use an extra dose of the antihistamine, if needed, for breakthrough symptoms.  - Consider nasal saline rinses 1-2 times daily to remove allergens from the nasal cavities as well as help with mucous clearance (this is especially helpful to do before the nasal sprays are given) - We will go ahead and start allergy  shots as a means of long-term control. - Allergy  shots re-train and reset the immune system to ignore environmental allergens and decrease the resulting immune response to those allergens (sneezing, itchy watery eyes, runny nose, nasal congestion, etc).    - Allergy  shots improve symptoms in 75-85% of patients.  - We will schedule you for a rush immunotherapy appointment to get through your vials more quickly and get you to maintenance.   2. Anaphylactic shock due to food (shellfish) - Continue to avoid shellfish. - EpiPen  refilled today.   3. Moderate persistent asthma, uncomplicated - Testing looked great today.  - Daily controller medication(s): Breo 100/20mcg one puff once daily - Prior to physical activity: albuterol  2 puffs 10-15 minutes before physical activity. - Rescue medications: albuterol  4 puffs every 4-6 hours as needed - Asthma control goals:  * Full participation in all desired activities (may need albuterol  before activity) * Albuterol  use two time or less a week on average (not counting use with activity) * Cough interfering with sleep two time or less a month * Oral steroids no more than once a year * No hospitalizations  4. Return in about 3 months (around 02/08/2024).  You can have the follow up appointment with Dr. Iva or a Nurse Practicioner (our Nurse Practitioners are excellent and always have Physician oversight!).    Please inform us  of any Emergency Department visits, hospitalizations, or changes in symptoms. Call us  before going to the ED for breathing or allergy  symptoms since we might be able to fit you in for a sick visit. Feel free to contact us  anytime with any questions, problems, or concerns.  It was a pleasure to see you again today!  Websites that have reliable patient information: 1. American Academy of Asthma, Allergy , and Immunology: www.aaaai.org 2. Food Pharmacist, hospital and Education (FARE): foodallergy.org 3. Mothers of Asthmatics: http://www.asthmacommunitynetwork.org 4. [employer] of Allergy , Asthma, and Immunology: www.acaai.org      "Like" us  on Facebook and Instagram for our latest updates!      A healthy democracy works best when [employer] participate! Make sure you are registered to vote! If you have moved or changed any of your contact information, you will need to get this updated before voting! Scan the QR codes below to learn more!       Allergy  Shots  Allergies are the result of a chain reaction that starts in the immune system. Your immune system controls how your body defends itself. For instance, if you have an allergy  to pollen, your immune system identifies pollen as an invader or allergen. Your immune system overreacts by producing antibodies called Immunoglobulin E (IgE). These antibodies travel to cells that release chemicals, causing an allergic reaction.  The concept behind allergy  immunotherapy, whether it is received in the form of shots or tablets,  is that the immune system can be desensitized to specific allergens that trigger allergy  symptoms. Although it requires time and patience, the payback can be long-term relief. Allergy  injections contain a dilute solution of those substances that  you are allergic to based upon your skin testing and allergy  history.   How Do Allergy  Shots Work?  Allergy  shots work much like a vaccine. Your body responds to injected amounts of a particular allergen given in increasing doses, eventually developing a resistance and tolerance to it. Allergy  shots can lead to decreased, minimal or no allergy  symptoms.  There generally are two phases: build-up and maintenance. Build-up often ranges from three to six months and involves receiving injections with increasing amounts of the allergens. The shots are typically given once or twice a week, though more rapid build-up schedules are sometimes used.  The maintenance phase begins when the most effective dose is reached. This dose is different for each person, depending on how allergic you are and your response to the build-up injections. Once the maintenance dose is reached, there are longer periods between injections, typically two to four weeks.  Occasionally doctors give cortisone-type shots that can temporarily reduce allergy  symptoms. These types of shots are different and should not be confused with allergy  immunotherapy shots.  Who Can Be Treated with Allergy  Shots?  Allergy  shots may be a good treatment approach for people with allergic rhinitis (hay fever), allergic asthma, conjunctivitis (eye allergy ) or stinging insect allergy .   Before deciding to begin allergy  shots, you should consider:   The length of allergy  season and the severity of your symptoms  Whether medications and/or changes to your environment can control your symptoms  Your desire to avoid long-term medication use  Time: allergy  immunotherapy requires a major time commitment  Cost: may vary depending on your insurance coverage  Allergy  shots for children age 83 and older are effective and often well tolerated. They might prevent the onset of new allergen sensitivities or the progression to asthma.  Allergy  shots are not  started on patients who are pregnant but can be continued on patients who become pregnant while receiving them. In some patients with other medical conditions or who take certain common medications, allergy  shots may be of risk. It is important to mention other medications you talk to your allergist.   What are the two types of build-ups offered:   RUSH or Rapid Desensitization -- one day of injections lasting from 8:30-4:30pm, injections every 1 hour.  Approximately half of the build-up process is completed in that one day.  The following week, normal build-up is resumed, and this entails ~16 visits either weekly or twice weekly, until reaching your "maintenance dose" which is continued weekly until eventually getting spaced out to every month for a duration of 3 to 5 years. The regular build-up appointments are nurse visits where the injections are administered, followed by required monitoring for 30 minutes.    Traditional build-up -- weekly visits for 6 -12 months until reaching "maintenance dose", then continue weekly until eventually spacing out to every 4 weeks as above. At these appointments, the injections are administered, followed by required monitoring for 30 minutes.     Either way is acceptable, and both are equally effective. With the rush protocol, the advantage is that less time is spent here for injections overall AND you would also reach maintenance dosing faster (which is when the clinical benefit starts to become more apparent). Not everyone is a candidate for rapid desensitization.  IF we proceed with the RUSH protocol, there are premedications which must be taken the day before and the day after the rush only (this includes antihistamines, steroids, and Singulair ).  After the rush day, no prednisone  or Singulair  is required, and we just recommend antihistamines taken on your injection day.  What Is An Estimate of the Costs?  If you are interested in starting allergy   injections, please check with your insurance company about your coverage for both allergy  vial sets and allergy  injections.  Please do so prior to making the appointment to start injections.  The following are CPT codes to give to your insurance company. These are the amounts we BILL to the insurance company, but the amount YOU WILL PAY and WE RECEIVE IS SUBSTANTIALLY LESS and depends on the contracts we have with different insurance companies.   Amount Billed to Insurance One allergy  vial set  CPT 95165   $ 1200     Two allergy  vial set  CPT 95165   $ 2400     Three allergy  vial set  CPT 95165   $ 3600     One injection   CPT 95115   $ 35  Two injections   CPT 95117   $ 40 RUSH (Rapid Desensitization) CPT 95180 x 8 hours $500/hour  Regarding the allergy  injections, your co-pay may or may not apply with each injection, so please confirm this with your insurance company. When you start allergy  injections, 1 or 2 sets of vials are made based on your allergies.  Not all patients can be on one set of vials. A set of vials lasts 6 months to a year depending on how quickly you can proceed with your build-up of your allergy  injections. Vials are personalized for each patient depending on their specific allergens.  How often are allergy  injection given during the build-up period?   Injections are given at least weekly during the build-up period until your maintenance dose is achieved. Per the doctor's discretion, you may have the option of getting allergy  injections two times per week during the build-up period. However, there must be at least 48 hours between injections. The build-up period is usually completed within 6-12 months depending on your ability to schedule injections and for adjustments for reactions. When maintenance dose is reached, your injection schedule is gradually changed to every two weeks and later to every three weeks. Injections will then continue every 4 weeks. Usually, injections are  continued for a total of 3-5 years.   When Will I Feel Better?  Some may experience decreased allergy  symptoms during the build-up phase. For others, it may take as long as 12 months on the maintenance dose. If there is no improvement after a year of maintenance, your allergist will discuss other treatment options with you.  If you aren't responding to allergy  shots, it may be because there is not enough dose of the allergen in your vaccine or there are missing allergens that were not identified during your allergy  testing. Other reasons could be that there are high levels of the allergen in your environment or major exposure to non-allergic triggers like tobacco smoke.  What Is the Length of Treatment?  Once the maintenance dose is reached, allergy  shots are generally continued for three to five years. The decision to stop should be discussed with your allergist at that time. Some people may experience a permanent reduction of allergy  symptoms. Others may relapse and a longer course of allergy  shots can be  considered.  What Are the Possible Reactions?  The two types of adverse reactions that can occur with allergy  shots are local and systemic. Common local reactions include very mild redness and swelling at the injection site, which can happen immediately or several hours after. Report a delayed reaction from your last injection. These include arm swelling or runny nose, watery eyes or cough that occurs within 12-24 hours after injection. A systemic reaction, which is less common, affects the entire body or a particular body system. They are usually mild and typically respond quickly to medications. Signs include increased allergy  symptoms such as sneezing, a stuffy nose or hives.   Rarely, a serious systemic reaction called anaphylaxis can develop. Symptoms include swelling in the throat, wheezing, a feeling of tightness in the chest, nausea or dizziness. Most serious systemic reactions develop  within 30 minutes of allergy  shots. This is why it is strongly recommended you wait in your doctor's office for 30 minutes after your injections. Your allergist is trained to watch for reactions, and his or her staff is trained and equipped with the proper medications to identify and treat them.   Report to the nurse immediately if you experience any of the following symptoms: swelling, itching or redness of the skin, hives, watery eyes/nose, breathing difficulty, excessive sneezing, coughing, stomach pain, diarrhea, or light headedness. These symptoms may occur within 15-20 minutes after injection and may require medication.   Who Should Administer Allergy  Shots?  The preferred location for receiving shots is your prescribing allergist's office. Injections can sometimes be given at another facility where the physician and staff are trained to recognize and treat reactions, and have received instructions by your prescribing allergist.  What if I am late for an injection?   Injection dose will be adjusted depending upon how many days or weeks you are late for your injection.   What if I am sick?   Please report any illness to the nurse before receiving injections. She may adjust your dose or postpone injections depending on your symptoms. If you have fever, flu, sinus infection or chest congestion it is best to postpone allergy  injections until you are better. Never get an allergy  injection if your asthma is causing you problems. If your symptoms persist, seek out medical care to get your health problem under control.  What If I am or Become Pregnant:  Women that become pregnant should schedule an appointment with [facility] before receiving any further allergy  injections.

## 2023-11-09 ENCOUNTER — Ambulatory Visit (INDEPENDENT_AMBULATORY_CARE_PROVIDER_SITE_OTHER): Admitting: Adult Health

## 2023-11-09 ENCOUNTER — Encounter: Payer: Self-pay | Admitting: Adult Health

## 2023-11-09 VITALS — BP 124/86 | HR 89 | Ht 64.0 in | Wt 247.5 lb

## 2023-11-09 DIAGNOSIS — Z30431 Encounter for routine checking of intrauterine contraceptive device: Secondary | ICD-10-CM | POA: Diagnosis not present

## 2023-11-09 NOTE — Progress Notes (Signed)
  Subjective:     Patient ID: Nichole Guerra, female   DOB: 07-Jun-1995, 28 y.o.   MRN: [id number]  HPI Nichole Guerra is a 28 year old white female,single, G0P0, in for IUD check up. She had mirena  placed 08/24/23 by Dr Ozan.     Component Value Date/Time   DIAGPAP  08/16/2023 1205    - Negative for intraepithelial lesion or malignancy (NILM)   DIAGPAP  08/06/2018 0000    NEGATIVE FOR INTRAEPITHELIAL LESIONS OR MALIGNANCY.   HPVHIGH Negative 08/16/2023 1205   ADEQPAP  08/16/2023 1205    Satisfactory for evaluation; transformation zone component PRESENT.   ADEQPAP  08/06/2018 0000    Satisfactory for evaluation  endocervical/transformation zone component PRESENT.    PCP is Dr Dettinger  Review of Systems Can feel strings Has had sex without any problems  Periods are lighter  Reviewed past medical,surgical, social and family history. Reviewed medications and allergies.     Objective:   Physical Exam BP 124/86 (BP Location: Left Arm, Patient Position: Sitting, Cuff Size: Large)   Pulse 89   Ht 5' 4 (1.626 m)   Wt 247 lb 8 oz (112.3 kg)   LMP 11/02/2023 (Approximate)   BMI 42.48 kg/m     Skin warm and dry.Pelvic: external genitalia is normal in appearance no lesions, vagina: pink,,urethra has no lesions or masses noted, cervix:smooth, +strings at os, uterus: normal size, shape and contour, non tender, no masses felt, adnexa: no masses or tenderness noted. Bladder is non tender and no masses felt. Fall risk is low  Upstream - 11/09/23 1205       Pregnancy Intention Screening   Does the patient want to become pregnant in the next year? No    Does the patient's partner want to become pregnant in the next year? No    Would the patient like to discuss contraceptive options today? No      Contraception Wrap Up   Current Method IUD or IUS    End Method IUD or IUS    Contraception Counseling Provided Yes         Examination chaperoned by Clarita Salt LPN  Assessment:     1. IUD check up  (Primary) +strings at os Mirena  placed 08/24/23 by Dr Marilynn     Plan:     Follow up prn

## 2023-11-27 ENCOUNTER — Other Ambulatory Visit (HOSPITAL_COMMUNITY): Payer: Self-pay

## 2023-11-30 ENCOUNTER — Encounter: Payer: Self-pay | Admitting: Family Medicine

## 2023-11-30 ENCOUNTER — Ambulatory Visit (INDEPENDENT_AMBULATORY_CARE_PROVIDER_SITE_OTHER): Admitting: Family Medicine

## 2023-11-30 VITALS — BP 108/1 | HR 76 | Ht 64.0 in | Wt 249.0 lb

## 2023-11-30 DIAGNOSIS — F3181 Bipolar II disorder: Secondary | ICD-10-CM | POA: Diagnosis not present

## 2023-11-30 DIAGNOSIS — F418 Other specified anxiety disorders: Secondary | ICD-10-CM

## 2023-11-30 DIAGNOSIS — F410 Panic disorder [episodic paroxysmal anxiety] without agoraphobia: Secondary | ICD-10-CM

## 2023-11-30 MED ORDER — CARIPRAZINE HCL 1.5 MG PO CAPS: 1.5000 mg | ORAL_CAPSULE | ORAL | 1 refills | Status: DC

## 2023-11-30 MED ORDER — SERTRALINE HCL 100 MG PO TABS: 100.0000 mg | ORAL_TABLET | Freq: Every day | ORAL | 1 refills | Status: AC

## 2023-11-30 NOTE — Progress Notes (Signed)
 BP (!) 108/1   Pulse 76   Ht 5' 4 (1.626 m)   Wt 249 lb (112.9 kg)   LMP 11/02/2023 (Approximate)   SpO2 (!) 76%   BMI 42.74 kg/m    Subjective:   Patient ID: Nichole Guerra, female    DOB: [birth year], 28 y.o.   MRN: [id number]  HPI: Nichole Guerra is a 28 y.o. female presenting on 11/30/2023 for Medical Management of Chronic Issues   HPI Depression with anxiety and possible bipolar 2 Patient is coming in for recheck for depression anxiety and bipolar.  She is currently taking Zoloft  and Vraylar .  She seems to be doing well and feels like the medicine has been very helpful and denies any suicidal ideations or panic attacks.  She says she is doing well except for she had a couple of episodes of hypoglycemia and low blood sugar in the 50s.  It does not happen often but has happened every now and then.    11/30/2023    8:41 AM 08/14/2023    8:37 AM 07/17/2023    9:58 AM 04/26/2023    4:01 PM 04/26/2023    3:57 PM  Depression screen PHQ 2/9  Decreased Interest 1 1 2 2  0  Down, Depressed, Hopeless 1 1 2 2  0  PHQ - 2 Score 2 2 4 4  0  Altered sleeping 1 1 3 3    Tired, decreased energy 1 1 2 3    Change in appetite 0 1 2 3    Feeling bad or failure about yourself  0 0 1 2   Trouble concentrating 1 0 2 2   Moving slowly or fidgety/restless 0 0 2 1   Suicidal thoughts 0 0 0 0   PHQ-9 Score 5 5 16 18    Difficult doing work/chores Not difficult at all Somewhat difficult Somewhat difficult Somewhat difficult      Relevant past medical, surgical, family and social history reviewed and updated as indicated. Interim medical history since our last visit reviewed. Allergies and medications reviewed and updated.  Review of Systems  Constitutional:  Negative for chills and fever.  Eyes:  Negative for visual disturbance.  Respiratory:  Negative for chest tightness and shortness of breath.   Cardiovascular:  Negative for chest pain and leg swelling.  Genitourinary:  Negative for difficulty  urinating and dysuria.  Musculoskeletal:  Negative for back pain and gait problem.  Skin:  Negative for rash.  Neurological:  Positive for dizziness. Negative for light-headedness and headaches.  Psychiatric/Behavioral:  Negative for agitation, behavioral problems, dysphoric mood and sleep disturbance. The patient is not nervous/anxious.   All other systems reviewed and are negative.   Per HPI unless specifically indicated above   Allergies as of 11/30/2023       Reactions   Penicillins Anaphylaxis   Throat swells up.   Shellfish-derived Products Anaphylaxis   Bactrim [sulfamethoxazole-trimethoprim] Hives   Hydrocodone Hives   Yellow Dyes (non-tartrazine) Rash        Medication List        Accurate as of November 30, 2023  9:12 AM. If you have any questions, ask your nurse or doctor.          albuterol  108 (90 Base) MCG/ACT inhaler Commonly known as: VENTOLIN  HFA Inhale 2 puffs into the lungs every 6 (six) hours as needed for wheezing or shortness of breath.   Azelastine -Fluticasone  137-50 MCG/ACT Susp Commonly known as: Dymista  Place 2 sprays into both nostrils 1 day or 1 dose.  cariprazine  1.5 MG capsule Commonly known as: Vraylar  Take 1 capsule (1.5 mg total) by mouth daily.   EpiPen  2-Pak 0.3 MG/0.3ML Soaj injection Generic drug: EPINEPHrine  Inject 0.3 mg into the muscle as needed for anaphylaxis.   fexofenadine  180 MG tablet Commonly known as: ALLEGRA  Take 1 tablet (180 mg total) by mouth 2 (two) times daily as needed for allergies or rhinitis.   fluticasone  furoate-vilanterol 100-25 MCG/ACT Aepb Commonly known as: BREO ELLIPTA  Inhale 1 puff into the lungs daily.   levonorgestrel  20 MCG/DAY Iud Commonly known as: MIRENA  1 each by Intrauterine route once.   montelukast  10 MG tablet Commonly known as: SINGULAIR  Take 1 tablet (10 mg total) by mouth at bedtime.   sertraline  100 MG tablet Commonly known as: ZOLOFT  Take 1 tablet (100 mg total) by mouth  daily.         Objective:   BP (!) 108/1   Pulse 76   Ht 5' 4 (1.626 m)   Wt 249 lb (112.9 kg)   LMP 11/02/2023 (Approximate)   SpO2 (!) 76%   BMI 42.74 kg/m   Wt Readings from Last 3 Encounters:  11/30/23 249 lb (112.9 kg)  11/09/23 247 lb 8 oz (112.3 kg)  11/08/23 247 lb 6 oz (112.2 kg)    Physical Exam Vitals and nursing note reviewed.  Constitutional:      General: She is not in acute distress.    Appearance: She is well-developed. She is not diaphoretic.  Eyes:     Conjunctiva/sclera: Conjunctivae normal.  Cardiovascular:     Rate and Rhythm: Normal rate and regular rhythm.     Heart sounds: Normal heart sounds. No murmur heard. Pulmonary:     Effort: Pulmonary effort is normal. No respiratory distress.     Breath sounds: Normal breath sounds. No wheezing.  Musculoskeletal:        General: No swelling.  Skin:    General: Skin is warm and dry.     Findings: No rash.  Neurological:     Mental Status: She is alert and oriented to person, place, and time.     Coordination: Coordination normal.  Psychiatric:        Behavior: Behavior normal.       Assessment & Plan:   Problem List Items Addressed This Visit       Other   Depression with anxiety   Relevant Medications   cariprazine  (VRAYLAR ) 1.5 MG capsule   sertraline  (ZOLOFT ) 100 MG tablet   Bipolar 2 disorder (HCC) - Primary   Relevant Medications   cariprazine  (VRAYLAR ) 1.5 MG capsule   sertraline  (ZOLOFT ) 100 MG tablet   Panic disorder   Relevant Medications   cariprazine  (VRAYLAR ) 1.5 MG capsule   sertraline  (ZOLOFT ) 100 MG tablet    Instructed patient that for hypoglycemic episodes she needs to focus on getting more protein in each meal which carbs so she does not crash after meals..  Also important to make sure she eats breakfast and not skip it in the mornings. Follow up plan: Return in about 3 months (around 03/01/2024), or if symptoms worsen or fail to improve, for Anxiety depression  recheck.  Counseling provided for all of the vaccine components No orders of the defined types were placed in this encounter.   Fonda Levins, MD [facility] 11/30/2023, 9:12 AM

## 2023-12-26 ENCOUNTER — Telehealth: Payer: Self-pay

## 2023-12-26 ENCOUNTER — Other Ambulatory Visit (HOSPITAL_COMMUNITY): Payer: Self-pay

## 2023-12-26 NOTE — Telephone Encounter (Signed)
*  AA  Pharmacy Patient Advocate Encounter   Received notification from CoverMyMeds that prior authorization for Fluticasone  Furoate-Vilanterol 100-25MCG/ACT aerosol powder  is required/requested.   Insurance verification completed.   The patient is insured through [facility] .   Per test claim:  Brand Advair HFA, Brand Advair Diskus, Brand Symbicort  is preferred by the insurance.  If suggested medication is appropriate, Please send in a new RX and discontinue this one. If not, please advise as to why it's not appropriate so that we may request a Prior Authorization. Please note, some preferred medications may still require a PA.  If the suggested medications have not been trialed and there are no contraindications to their use, the PA will not be submitted, as it will not be approved.   CMM Key: [id number]

## 2023-12-27 MED ORDER — BUDESONIDE-FORMOTEROL FUMARATE 160-4.5 MCG/ACT IN AERO
2.0000 | INHALATION_SPRAY | Freq: Two times a day (BID) | RESPIRATORY_TRACT | 5 refills | Status: AC
Start: 2023-12-27 — End: ?

## 2023-12-27 NOTE — Telephone Encounter (Signed)
 I sent in Symbicort . Please let the patient know.

## 2024-01-10 ENCOUNTER — Encounter: Payer: Self-pay | Admitting: Family Medicine

## 2024-01-19 ENCOUNTER — Encounter: Payer: Self-pay | Admitting: Family Medicine

## 2024-01-19 DIAGNOSIS — J4540 Moderate persistent asthma, uncomplicated: Secondary | ICD-10-CM

## 2024-01-22 MED ORDER — ALBUTEROL SULFATE HFA 108 (90 BASE) MCG/ACT IN AERS: 2.0000 | INHALATION_SPRAY | Freq: Four times a day (QID) | RESPIRATORY_TRACT | 1 refills | Status: AC | PRN

## 2024-02-07 ENCOUNTER — Ambulatory Visit: Admitting: Adult Health

## 2024-02-19 NOTE — Progress Notes (Deleted)
   [address] [place] [place] [address] Dept: [phone]  FOLLOW UP NOTE  Patient ID: Nichole Guerra, female    DOB: 10/04/1995  Age: 28 y.o. MRN: [id number] Date of Office Visit: 02/21/2024  Assessment  Chief Complaint: No chief complaint on file.  HPI Nichole Guerra is a 28 year old female who presents to the clinic for follow-up visit.  She was last seen in this clinic on 11/08/2023 by Dr. Iva for evaluation of asthma, allergic rhinitis, recurrent infection, and food allergy  to shellfish.  Her last environmental allergy  skin testing on 11/24/2021 was positive to grass pollen, weed pollen, ragweed pollen, tree pollen, indoor mold, outdoor mold, cat, dog, dust mite, cockroach, hamster, israel pig, and rabbit.  Her last food allergy  skin testing on 11/24/2021 was positive to shellfish.  Discussed the use of AI scribe software for clinical note transcription with the patient, who gave verbal consent to proceed.  History of Present Illness      Drug Allergies:  Allergies  Allergen Reactions   Penicillins Anaphylaxis    Throat swells up.   Shellfish-Derived Products Anaphylaxis   Bactrim [Sulfamethoxazole-Trimethoprim] Hives   Hydrocodone Hives   Yellow Dyes (Non-Tartrazine) Rash    Physical Exam: There were no vitals taken for this visit.   Physical Exam  Diagnostics:    Assessment and Plan: No diagnosis found.  No orders of the defined types were placed in this encounter.   There are no Patient Instructions on file for this visit.  No follow-ups on file.    Thank you for the opportunity to care for this patient.  Please do not hesitate to contact me with questions.  Arlean Mutter, FNP [facility]

## 2024-02-19 NOTE — Patient Instructions (Incomplete)
 Asthma Continue Breo 100-1 puff once a day to prevent cough or wheeze Continue albuterol  2 puffs once every 4 hours if needed for cough or wheeze You may use albuterol  2 puffs 5-15 minutes before activity to decrease cough or wheeze   Allergic rhinitis Continue allergen avoidance measures directed toward grass pollen, weed pollen, ragweed pollen, tree pollen, indoor mold, outdoor mold, dust mite, cat, dog, cockroach, hamster, israel pig, and rabbit as listed below  Recurrent infection Continue to keep track of infection, antibiotic use, and steroid use. Consider laboratory workup if infection rate increases  Food allergy  Continue to avoid shellfish In case of an allergic reaction, take cetirizine 10 mg once every 12-24 hours, and if life-threatening symptoms occur, inject with EpiPen  0.3 mg. Consider updating your food allergy  skin testing.  Remember to stop antihistamines for 3 days before your testing appointment  Call the clinic if this treatment plan is not working well for you.  Follow up in *** or sooner if needed.  Reducing Pollen Exposure The American Academy of Allergy , Asthma and Immunology suggests the following steps to reduce your exposure to pollen during allergy  seasons. Do not hang sheets or clothing out to dry; pollen may collect on these items. Do not mow lawns or spend time around freshly cut grass; mowing stirs up pollen. Keep windows closed at night.  Keep car windows closed while driving. Minimize morning activities outdoors, a time when pollen counts are usually at their highest. Stay indoors as much as possible when pollen counts or humidity is high and on windy days when pollen tends to remain in the air longer. Use air conditioning when possible.  Many air conditioners have filters that trap the pollen spores. Use a HEPA room air filter to remove pollen form the indoor air you breathe.  Control of Mold Allergen Mold and fungi can grow on a variety of surfaces  provided certain temperature and moisture conditions exist.  Outdoor molds grow on plants, decaying vegetation and soil.  The major outdoor mold, Alternaria and Cladosporium, are found in very high numbers during hot and dry conditions.  Generally, a late Summer - Fall peak is seen for common outdoor fungal spores.  Rain will temporarily lower outdoor mold spore count, but counts rise rapidly when the rainy period ends.  The most important indoor molds are Aspergillus and Penicillium.  Dark, humid and poorly ventilated basements are ideal sites for mold growth.  The next most common sites of mold growth are the bathroom and the kitchen.  Outdoor [employer] Use air conditioning and keep windows closed Avoid exposure to decaying vegetation. Avoid leaf raking. Avoid grain handling. Consider wearing a face mask if working in moldy areas.  Indoor Mold Control Maintain humidity below 50%. Clean washable surfaces with 5% bleach solution. Remove sources e.g. Contaminated carpets.   Control of Dust Mite Allergen Dust mites play a major role in allergic asthma and rhinitis. They occur in environments with high humidity wherever human skin is found. Dust mites absorb humidity from the atmosphere (ie, they do not drink) and feed on organic matter (including shed human and animal skin). Dust mites are a microscopic type of insect that you cannot see with the naked eye. High levels of dust mites have been detected from mattresses, pillows, carpets, upholstered furniture, bed covers, clothes, soft toys and any woven material. The principal allergen of the dust mite is found in its feces. A gram of dust may contain 1,000 mites and 250,000 fecal particles.  Mite antigen is easily measured in the air during house cleaning activities. Dust mites do not bite and do not cause harm to humans, other than by triggering allergies/asthma.  Ways to decrease your exposure to dust mites in your home:  1. Encase mattresses,  box springs and pillows with a mite-impermeable barrier or cover  2. Wash sheets, blankets and drapes weekly in hot water (130 F) with detergent and dry them in a dryer on the hot setting.  3. Have the room cleaned frequently with a vacuum cleaner and a damp dust-mop. For carpeting or rugs, vacuuming with a vacuum cleaner equipped with a high-efficiency particulate air (HEPA) filter. The dust mite allergic individual should not be in a room which is being cleaned and should wait 1 hour after cleaning before going into the room.  4. Do not sleep on upholstered furniture (eg, couches).  5. If possible removing carpeting, upholstered furniture and drapery from the home is ideal. Horizontal blinds should be eliminated in the rooms where the person spends the most time (bedroom, study, television room). Washable vinyl, roller-type shades are optimal.  6. Remove all non-washable stuffed toys from the bedroom. Wash stuffed toys weekly like sheets and blankets above.  7. Reduce indoor humidity to less than 50%. Inexpensive humidity monitors can be purchased at most hardware stores. Do not use a humidifier as can make the problem worse and are not recommended.  Control of Dog or Cat Allergen Avoidance is the best way to manage a dog or cat allergy . If you have a dog or cat and are allergic to dog or cats, consider removing the dog or cat from the home. If you have a dog or cat but don't want to find it a new home, or if your family wants a pet even though someone in the household is allergic, here are some strategies that may help keep symptoms at bay:  Keep the pet out of your bedroom and restrict it to only a few rooms. Be advised that keeping the dog or cat in only one room will not limit the allergens to that room. Don't pet, hug or kiss the dog or cat; if you do, wash your hands with soap and water. High-efficiency particulate air (HEPA) cleaners run continuously in a bedroom or living room can  reduce allergen levels over time. Regular use of a high-efficiency vacuum cleaner or a central vacuum can reduce allergen levels. Giving your dog or cat a bath at least once a week can reduce airborne allergen.  Control of Cockroach Allergen Cockroach allergen has been identified as an important cause of acute attacks of asthma, especially in urban settings.  There are fifty-five species of cockroach that exist in the [place] , however only three, the [place], [place] and [place] species produce allergen that can affect patients with Asthma.  Allergens can be obtained from fecal particles, egg casings and secretions from cockroaches.    Remove food sources. Reduce access to water. Seal access and entry points. Spray runways with 0.5-1% Diazinon or Chlorpyrifos Blow boric acid power under stoves and refrigerator. Place bait stations (hydramethylnon) at feeding sites.

## 2024-02-21 ENCOUNTER — Ambulatory Visit: Admitting: Family Medicine

## 2024-03-06 ENCOUNTER — Ambulatory Visit: Admitting: Family Medicine

## 2024-03-06 ENCOUNTER — Encounter: Payer: Self-pay | Admitting: Family Medicine

## 2024-04-01 ENCOUNTER — Encounter: Payer: Self-pay | Admitting: Family Medicine

## 2024-04-22 ENCOUNTER — Ambulatory Visit: Payer: Self-pay

## 2024-04-22 NOTE — Telephone Encounter (Addendum)
 FYI Only or Action Required?: FYI only for provider: appointment scheduled on 04/23/24.  Patient was last seen in primary care on 11/30/2023 by Dettinger, Fonda LABOR, MD.  Called Nurse Triage reporting Depression.  Symptoms began several weeks ago.  Interventions attempted: Prescription medications: Sertraline , Cariprazine .  Symptoms are: gradually worsening.  Triage Disposition: See Physician Within 24 Hours  Patient/caregiver understands and will follow disposition?: Yes  Reason for Disposition  [1] Depression AND [2] getting worse (e.g., sleeping poorly, less able to do activities of daily living)  Answer Assessment - Initial Assessment Questions Patient called in and stated that she has been feeling depressed, but feels it is getting worse. She is not working right now and has had some personal things going on that seem to be contributing. She states she does not have much motivation and spends a lot of time in bed and eating. She mentioned that she tried to reach out to another resource, but with the weather they were unable to assist her so she is reaching out to PCP. She was speaking with her parents today about how she is feeling and decided to call in. She denies SI. Advised to come in for office visit, appt scheduled for 04/24/24. Advised to reach out to [phone] if she felt like she needed to. Advised to call back or go to ED if symptoms worsen.   1. CONCERN: What happened that made you call today?     Talked with parents about how she's feeling  2. DEPRESSION SYMPTOM SCREENING: How are you feeling overall? (e.g., decreased energy, increased sleeping or difficulty sleeping, difficulty concentrating, feelings of sadness, guilt, hopelessness, or worthlessness)     Less motivation, sleeping more, just laying in bed eating and can manage to shower or clean sometimes  3. RISK OF HARM - SUICIDAL IDEATION:  Do you ever have thoughts of hurting or killing yourself?  (e.g., yes, no, no but  preoccupation with thoughts about death)     Denies thoughts of killing self  4. RISK OF HARM - HOMICIDAL IDEATION:  Do you ever have thoughts of hurting or killing someone else?  (e.g., yes, no, no but preoccupation with thoughts about death)     Denies thoughts of hurting others  5. FUNCTIONAL IMPAIRMENT: How have things been going for you overall? Have you had more difficulty than usual doing your normal daily activities?  (e.g., better, same, worse; self-care, school, work, interactions)     Yes, feels like its getting worse  6. SUPPORT: Who is with you now? Who do you live with? Do you have family or friends who you can talk to?      Lives with parents and can talk with them  7. THERAPIST: Do you have a counselor or therapist? If Yes, ask: What is their name?     No, has not had one since she was 15  8. STRESSORS: Has there been any new stress or recent changes in your life?     Yes, patient states she has had some personal things going on that seem to be contributing  9. ALCOHOL USE OR SUBSTANCE USE (DRUG USE): Do you drink alcohol or use any illegal drugs?     No  10. OTHER: Do you have any other physical symptoms right now? (e.g., fever)       No  11. PREGNANCY: Is there any chance you are pregnant? When was your last menstrual period?       No  Protocols used: Depression-A-AH  Copied from CRM #[id number]. Topic: Clinical - Red Word Triage >> Apr 22, 2024  5:00 PM Brittney F wrote: [facility] that prompted transfer to Nurse Triage:   Concern: Worsening Mental Health  Symptoms:   -Comfort eating -In the bed all day -Lack of motivation   When did the symptoms start?: gradually past couple of month  What have you done to aid in the concern ? Have you taken anything to assist with the matter?: Yes   If so, what did you take?:sertraline  (ZOLOFT ) 100 MG tablet; cariprazine  (VRAYLAR ) 1.5 MG capsule   Wanted to let you know I will be transferring you  to further discuss your concern. Please be advised the nurse can assist with scheduling.

## 2024-04-23 ENCOUNTER — Ambulatory Visit

## 2024-04-25 ENCOUNTER — Ambulatory Visit

## 2024-04-25 ENCOUNTER — Encounter: Payer: Self-pay | Admitting: Family Medicine

## 2024-04-25 VITALS — BP 125/88 | HR 73 | Ht 64.0 in | Wt 264.0 lb

## 2024-04-25 DIAGNOSIS — F418 Other specified anxiety disorders: Secondary | ICD-10-CM | POA: Diagnosis not present

## 2024-04-25 DIAGNOSIS — F410 Panic disorder [episodic paroxysmal anxiety] without agoraphobia: Secondary | ICD-10-CM

## 2024-04-25 DIAGNOSIS — F3181 Bipolar II disorder: Secondary | ICD-10-CM

## 2024-04-25 DIAGNOSIS — Z23 Encounter for immunization: Secondary | ICD-10-CM | POA: Diagnosis not present

## 2024-04-25 DIAGNOSIS — F332 Major depressive disorder, recurrent severe without psychotic features: Secondary | ICD-10-CM

## 2024-04-25 MED ORDER — CARIPRAZINE HCL 3 MG PO CAPS: 3.0000 mg | ORAL_CAPSULE | ORAL | 1 refills | Status: AC

## 2024-04-25 NOTE — Progress Notes (Signed)
 BP 125/88   Pulse 73   Ht 5' 4 (1.626 m)   Wt 264 lb (119.7 kg)   SpO2 98%   BMI 45.32 kg/m    Subjective:   Patient ID: Nichole Guerra, female    DOB: 1996/02/04, 28 y.o.   MRN: [id number]  HPI: Nichole Guerra is a 28 y.o. female presenting on 04/25/2024 for Medical Management of Chronic Issues, Depression, and Anxiety   Discussed the use of AI scribe software for clinical note transcription with the patient, who gave verbal consent to proceed.  History of Present Illness   Nichole Guerra is a 28 year old female with bipolar disorder who presents with decreased motivation and increased anxiety. She is accompanied by her boyfriend, Nichole Guerra.  Mood and motivation disturbance - Decreased motivation and drive, often remaining in bed for extended periods - Desire to return to school and work, but lack of motivation is a barrier - Excessive sleep - No thoughts of self-harm or suicide  Anxiety and compulsive behaviors - Increased anxiety - Compulsive picking at skin imperfections  Appetite and weight changes - Increased appetite, attributed to comfort eating - Recent weight gain  Psychotropic medication response - Currently taking Zoloft  and Vraylar  - Perceived decreased efficacy of Zoloft  over time - Vraylar  taken for approximately one year, effective in preventing hypomanic episodes - Missed doses of Vraylar  result in staying awake for days  Psychosocial stressors - Boyfriend's upcoming rehabilitation for alcohol and cocaine use is a significant stressor - Hopeful that rehabilitation will improve her situation  Substance use - Minimal alcohol intake - No illicit substance use         04/25/2024    8:11 AM 11/30/2023    8:41 AM 08/14/2023    8:37 AM 07/17/2023    9:58 AM 04/26/2023    4:01 PM  Depression screen PHQ 2/9  Decreased Interest 3 1 1 2 2   Down, Depressed, Hopeless 3 1 1 2 2   PHQ - 2 Score 6 2 2 4 4   Altered sleeping 3 1 1 3 3   Tired, decreased energy 3 1 1 2 3    Change in appetite 3 0 1 2 3   Feeling bad or failure about yourself  3 0 0 1 2  Trouble concentrating 2 1 0 2 2  Moving slowly or fidgety/restless 3 0 0 2 1  Suicidal thoughts 0 0 0 0 0  PHQ-9 Score 23 5  5  16  18    Difficult doing work/chores Extremely dIfficult Not difficult at all Somewhat difficult Somewhat difficult Somewhat difficult     Data saved with a previous flowsheet row definition      Relevant past medical, surgical, family and social history reviewed and updated as indicated. Interim medical history since our last visit reviewed. Allergies and medications reviewed and updated.  Review of Systems  Constitutional:  Positive for appetite change and unexpected weight change. Negative for chills and fever.  HENT:  Negative for congestion, ear discharge and ear pain.   Eyes:  Negative for redness and visual disturbance.  Respiratory:  Negative for chest tightness and shortness of breath.   Cardiovascular:  Negative for chest pain and leg swelling.  Genitourinary:  Negative for difficulty urinating and dysuria.  Musculoskeletal:  Negative for back pain and gait problem.  Skin:  Negative for rash.  Neurological:  Negative for light-headedness and headaches.  Psychiatric/Behavioral:  Positive for dysphoric mood. Negative for agitation, behavioral problems, self-injury, sleep disturbance and suicidal ideas. The  patient is nervous/anxious.   All other systems reviewed and are negative.   Per HPI unless specifically indicated above   Allergies as of 04/25/2024       Reactions   Penicillins Anaphylaxis   Throat swells up.   Shellfish Protein-containing Drug Products Anaphylaxis   Bactrim [sulfamethoxazole-trimethoprim] Hives   Hydrocodone Hives   Yellow Dyes (non-tartrazine) Rash        Medication List        Accurate as of April 25, 2024  8:24 AM. If you have any questions, ask your nurse or doctor.          STOP taking these medications     budesonide -formoterol  160-4.5 MCG/ACT inhaler Commonly known as: Symbicort  Stopped by: Fonda Levins, MD       TAKE these medications    albuterol  108 (90 Base) MCG/ACT inhaler Commonly known as: VENTOLIN  HFA Inhale 2 puffs into the lungs every 6 (six) hours as needed for wheezing or shortness of breath.   Azelastine -Fluticasone  137-50 MCG/ACT Susp Commonly known as: Dymista  Place 2 sprays into both nostrils 1 day or 1 dose.   cariprazine  3 MG capsule Commonly known as: Vraylar  Take 1 capsule (3 mg total) by mouth daily. What changed:  medication strength how much to take Changed by: Fonda Levins, MD   EpiPen  2-Pak 0.3 MG/0.3ML Soaj injection Generic drug: EPINEPHrine  Inject 0.3 mg into the muscle as needed for anaphylaxis.   fexofenadine  180 MG tablet Commonly known as: ALLEGRA  Take 1 tablet (180 mg total) by mouth 2 (two) times daily as needed for allergies or rhinitis.   fluticasone  furoate-vilanterol 100-25 MCG/ACT Aepb Commonly known as: BREO ELLIPTA  Inhale 1 puff into the lungs daily.   levonorgestrel  20 MCG/DAY Iud Commonly known as: MIRENA  1 each by Intrauterine route once.   montelukast  10 MG tablet Commonly known as: SINGULAIR  Take 1 tablet (10 mg total) by mouth at bedtime.   sertraline  100 MG tablet Commonly known as: ZOLOFT  Take 1 tablet (100 mg total) by mouth daily.         Objective:   BP 125/88   Pulse 73   Ht 5' 4 (1.626 m)   Wt 264 lb (119.7 kg)   SpO2 98%   BMI 45.32 kg/m   Wt Readings from Last 3 Encounters:  04/25/24 264 lb (119.7 kg)  11/30/23 249 lb (112.9 kg)  11/09/23 247 lb 8 oz (112.3 kg)    Physical Exam Vitals and nursing note reviewed.  Psychiatric:        Mood and Affect: Mood is anxious and depressed.        Thought Content: Thought content does not include suicidal ideation. Thought content does not include suicidal plan.    Physical Exam   CHEST: Lungs clear to auscultation. CARDIOVASCULAR:  Heart sounds regular.         Assessment & Plan:   Problem List Items Addressed This Visit       Other   Depression, major, severe recurrence (HCC)   Relevant Medications   cariprazine  (VRAYLAR ) 3 MG capsule   Bipolar 2 disorder (HCC) - Primary   Relevant Medications   cariprazine  (VRAYLAR ) 3 MG capsule   Panic disorder   Relevant Medications   cariprazine  (VRAYLAR ) 3 MG capsule   Other Visit Diagnoses       Depression with anxiety       Relevant Medications   cariprazine  (VRAYLAR ) 3 MG capsule          Bipolar II  disorder Hypomanic episodes managed with Vraylar . No recent manic episodes reported. - Increased Vraylar  to 3 mg daily by taking two 1.5 mg tablets together. - Reassess in 3-4 weeks to evaluate effectiveness of increased Vraylar  dose.  Major depressive disorder, recurrent severe without psychotic features Recurrent severe major depressive disorder with anhedonia, hypersomnia, and increased appetite. Zoloft  may have lost efficacy. No suicidal ideation reported. - Increased Vraylar  to 3 mg daily by taking two 1.5 mg tablets together. - Reassess in 3-4 weeks to evaluate effectiveness of increased Vraylar  dose. - Consider transitioning off Zoloft  if symptoms improve with increased Vraylar  dose.          Follow up plan: Return in about 4 weeks (around 05/23/2024), or if symptoms worsen or fail to improve, for Anxiety depression recheck.  Counseling provided for all of the vaccine components No orders of the defined types were placed in this encounter.   Fonda Levins, MD [facility] 04/25/2024, 8:24 AM

## 2024-05-12 ENCOUNTER — Other Ambulatory Visit: Payer: Self-pay | Admitting: Family Medicine

## 2024-05-12 ENCOUNTER — Other Ambulatory Visit: Payer: Self-pay | Admitting: Allergy & Immunology

## 2024-05-12 DIAGNOSIS — F3181 Bipolar II disorder: Secondary | ICD-10-CM

## 2024-05-12 DIAGNOSIS — F418 Other specified anxiety disorders: Secondary | ICD-10-CM

## 2024-05-12 DIAGNOSIS — F410 Panic disorder [episodic paroxysmal anxiety] without agoraphobia: Secondary | ICD-10-CM

## 2024-05-15 ENCOUNTER — Encounter: Payer: Self-pay | Admitting: Obstetrics & Gynecology

## 2024-05-21 ENCOUNTER — Ambulatory Visit (INDEPENDENT_AMBULATORY_CARE_PROVIDER_SITE_OTHER): Admitting: Adult Health

## 2024-05-21 ENCOUNTER — Encounter: Payer: Self-pay | Admitting: Adult Health

## 2024-05-21 VITALS — BP 124/85 | HR 97 | Ht 62.0 in | Wt 264.0 lb

## 2024-05-21 DIAGNOSIS — Z319 Encounter for procreative management, unspecified: Secondary | ICD-10-CM | POA: Diagnosis not present

## 2024-05-21 DIAGNOSIS — R102 Pelvic and perineal pain unspecified side: Secondary | ICD-10-CM | POA: Diagnosis not present

## 2024-05-21 DIAGNOSIS — Z30432 Encounter for removal of intrauterine contraceptive device: Secondary | ICD-10-CM

## 2024-05-21 NOTE — Progress Notes (Signed)
" °  Subjective:     Patient ID: Nichole Guerra, female   DOB: Feb 25, 1996, 29 y.o.   MRN: [id number]  HPI Nichole Guerra is a 29 year old white female, single, G0P0, in requesting IUD removal, has had left abdominal and back pain and wants to get pregnant.     Component Value Date/Time   DIAGPAP  08/16/2023 1205    - Negative for intraepithelial lesion or malignancy (NILM)   DIAGPAP  08/06/2018 0000    NEGATIVE FOR INTRAEPITHELIAL LESIONS OR MALIGNANCY.   HPVHIGH Negative 08/16/2023 1205   ADEQPAP  08/16/2023 1205    Satisfactory for evaluation; transformation zone component PRESENT.   ADEQPAP  08/06/2018 0000    Satisfactory for evaluation  endocervical/transformation zone component PRESENT.   PCP is Dr Dettinger   Review of Systems For IUD removal  Left abdominal and back pain Reviewed past medical,surgical, social and family history. Reviewed medications and allergies.     Objective:   Physical Exam BP 124/85 (BP Location: Left Arm, Patient Position: Sitting, Cuff Size: Large)   Pulse 97   Ht 5' 2 (1.575 m)   Wt 264 lb (119.7 kg)   BMI 48.29 kg/m   IUD REMOVAL Consent signed and time out called Skin warm and dry.Pelvic: external genitalia is normal in appearance no lesions, vagina: pink,urethra has no lesions or masses noted, cervix:smooth, no strings at os, used EC brush to tease out strings and then grasped with kelly forceps, asked pt to cough and IUD easily removed, uterus: normal size, shape and contour, non tender, no masses felt, adnexa: no masses or tenderness noted. Bladder is non tender and no masses felt.  [facility] - 05/21/24 1151       Pregnancy Intention Screening   Does the patient want to become pregnant in the next year? Yes    Does the patient's partner want to become pregnant in the next year? Yes    Would the patient like to discuss contraceptive options today? No      Contraception Wrap Up   Current Method IUD or IUS    End Method Pregnant/Seeking Pregnancy     Contraception Counseling Provided No         Examination chaperoned by Clarita Salt LPN     Assessment:     1. Encounter for IUD removal (Primary) IUD removed   2. Patient desires pregnancy Take OTC PNV  3. Pelvic pain If does not resolve, let me know    Plan:     Follow up prn     "

## 2024-05-23 ENCOUNTER — Ambulatory Visit (INDEPENDENT_AMBULATORY_CARE_PROVIDER_SITE_OTHER): Admitting: Family Medicine

## 2024-05-23 ENCOUNTER — Encounter: Payer: Self-pay | Admitting: Family Medicine

## 2024-05-23 VITALS — BP 129/85 | HR 92 | Ht 62.0 in | Wt 264.0 lb

## 2024-05-23 DIAGNOSIS — F410 Panic disorder [episodic paroxysmal anxiety] without agoraphobia: Secondary | ICD-10-CM

## 2024-05-23 DIAGNOSIS — Z3009 Encounter for other general counseling and advice on contraception: Secondary | ICD-10-CM | POA: Diagnosis not present

## 2024-05-23 DIAGNOSIS — F3181 Bipolar II disorder: Secondary | ICD-10-CM

## 2024-05-23 DIAGNOSIS — F332 Major depressive disorder, recurrent severe without psychotic features: Secondary | ICD-10-CM

## 2024-05-23 MED ORDER — ETONOGESTREL-ETHINYL ESTRADIOL 0.12-0.015 MG/24HR VA RING: VAGINAL_RING | VAGINAL | 12 refills | Status: AC

## 2024-05-23 NOTE — Progress Notes (Signed)
 "  BP 129/85   Pulse 92   Ht 5' 2 (1.575 m)   Wt 264 lb (119.7 kg)   SpO2 97%   BMI 48.29 kg/m    Subjective:   Patient ID: Nichole Guerra, female    DOB: [birth year], 29 y.o.   MRN: [id number]  HPI: Nichole Guerra is a 29 y.o. female presenting on 05/23/2024 for Medical Management of Chronic Issues, Anxiety, Depression, and contraceptive management (Mirena  removed last week due to abdominal and back pain. After removal had HA, vomiting, and cold. Pt is not sure what birth control to start on. Worried about weight gain.)   Discussed the use of AI scribe software for clinical note transcription with the patient, who gave verbal consent to proceed.  History of Present Illness   Nichole Guerra is a 29 year old female with anxiety and bipolar disorder who presents for a recheck of her mental health medications and birth control management.  Mood and anxiety symptoms - Anxiety and bipolar disorder symptoms have improved significantly after last Vraylar  dosage adjustment - Current mood described as 'a happy medium' - No major mood swings - No thoughts of self-harm or suicidal ideation - Continues to take Zoloft  and Vraylar  without side effects  Contraceptive management and hormonal symptoms - Recently had Mirena  IUD removed due to significant pain in the front and back - Experienced hormonal withdrawal reaction after IUD removal, including mood swings and nausea with eating; these symptoms have resolved - Sexually active and seeking alternative birth control that does not contribute to weight gain - Previously used NuvaRing and oral contraceptive pills; prefers NuvaRing due to ease of use and lack of weight gain - Prefers non-daily birth control methods due to difficulty remembering daily pills         05/23/2024    8:52 AM 04/25/2024    8:11 AM 11/30/2023    8:41 AM 08/14/2023    8:37 AM 07/17/2023    9:58 AM  Depression screen PHQ 2/9  Decreased Interest 1 3 1 1 2   Down, Depressed,  Hopeless 1 3 1 1 2   PHQ - 2 Score 2 6 2 2 4   Altered sleeping 1 3 1 1 3   Tired, decreased energy 1 3 1 1 2   Change in appetite 1 3 0 1 2  Feeling bad or failure about yourself  0 3 0 0 1  Trouble concentrating 0 2 1 0 2  Moving slowly or fidgety/restless 0 3 0 0 2  Suicidal thoughts 0 0 0 0 0  PHQ-9 Score 5 23 5  5  16    Difficult doing work/chores Somewhat difficult Extremely dIfficult Not difficult at all Somewhat difficult Somewhat difficult     Data saved with a previous flowsheet row definition       Relevant past medical, surgical, family and social history reviewed and updated as indicated. Interim medical history since our last visit reviewed. Allergies and medications reviewed and updated.  Review of Systems  Constitutional:  Negative for chills and fever.  HENT:  Negative for congestion, ear discharge and ear pain.   Eyes:  Negative for redness and visual disturbance.  Respiratory:  Negative for chest tightness and shortness of breath.   Cardiovascular:  Negative for chest pain and leg swelling.  Genitourinary:  Positive for menstrual problem. Negative for difficulty urinating and dysuria.  Skin:  Negative for rash.  Neurological:  Positive for dizziness. Negative for light-headedness and headaches.  Psychiatric/Behavioral:  Negative for agitation, behavioral  problems, dysphoric mood, self-injury, sleep disturbance and suicidal ideas. The patient is not nervous/anxious.   All other systems reviewed and are negative.   Per HPI unless specifically indicated above   Allergies as of 05/23/2024       Reactions   Penicillins Anaphylaxis   Throat swells up.   Shellfish Protein-containing Drug Products Anaphylaxis   Bactrim [sulfamethoxazole-trimethoprim] Hives   Hydrocodone Hives   Yellow Dyes 6, 10, And 11 Rash        Medication List        Accurate as of May 23, 2024  9:13 AM. If you have any questions, ask your nurse or doctor.          albuterol  108  (90 Base) MCG/ACT inhaler Commonly known as: VENTOLIN  HFA Inhale 2 puffs into the lungs every 6 (six) hours as needed for wheezing or shortness of breath.   cariprazine  3 MG capsule Commonly known as: Vraylar  Take 1 capsule (3 mg total) by mouth daily.   EpiPen  2-Pak 0.3 MG/0.3ML Soaj injection Generic drug: EPINEPHrine  Inject 0.3 mg into the muscle as needed for anaphylaxis.   etonogestrel -ethinyl estradiol  0.12-0.015 MG/24HR vaginal ring Commonly known as: NuvaRing Insert vaginally and leave in place for 3 consecutive weeks, then remove for 1 week. Started by: Fonda Levins, MD   fexofenadine  180 MG tablet Commonly known as: ALLEGRA  Take 1 tablet (180 mg total) by mouth 2 (two) times daily as needed for allergies or rhinitis.   fluticasone  50 MCG/ACT nasal spray Commonly known as: FLONASE  Place 1 spray into both nostrils daily.   fluticasone  furoate-vilanterol 100-25 MCG/ACT Aepb Commonly known as: BREO ELLIPTA  Inhale 1 puff into the lungs daily.   montelukast  10 MG tablet Commonly known as: SINGULAIR  TAKE 1 TABLET BY MOUTH AT BEDTIME   sertraline  100 MG tablet Commonly known as: ZOLOFT  Take 1 tablet (100 mg total) by mouth daily.         Objective:   BP 129/85   Pulse 92   Ht 5' 2 (1.575 m)   Wt 264 lb (119.7 kg)   SpO2 97%   BMI 48.29 kg/m   Wt Readings from Last 3 Encounters:  05/23/24 264 lb (119.7 kg)  05/21/24 264 lb (119.7 kg)  04/25/24 264 lb (119.7 kg)    Physical Exam Vitals and nursing note reviewed.  Abdominal:     General: Abdomen is flat. Bowel sounds are normal. There is no distension.     Palpations: Abdomen is soft.     Tenderness: There is no abdominal tenderness. There is no guarding or rebound.    Physical Exam   CHEST: Lungs clear to auscultation bilaterally. CARDIOVASCULAR: Heart regular rate and rhythm, no murmurs.         Assessment & Plan:   Problem List Items Addressed This Visit       Other   Depression,  major, severe recurrence (HCC) - Primary   Bipolar 2 disorder (HCC)   Panic disorder   Other Visit Diagnoses       Birth control counseling       Relevant Medications   etonogestrel -ethinyl estradiol  (NUVARING) 0.12-0.015 MG/24HR vaginal ring           Bipolar II disorder with comorbid major depressive disorder and panic disorder Well-managed on Vraylar  and Zoloft . No major mood swings, suicidal ideations, or self-harm thoughts. No side effects from Vraylar . - Continue Vraylar  and Zoloft  as prescribed. - Follow-up in three months unless symptoms worsen.  Contraceptive management and  post-IUD hormonal symptoms Experienced pain and hormonal withdrawal symptoms post-Mirena  removal. Symptoms improving. Prefers NuvaRing, previously used without issues. - Prescribed NuvaRing for contraception. - Monitor for issues with NuvaRing and report if she arises.          Follow up plan: Return in about 3 months (around 08/21/2024), or if symptoms worsen or fail to improve, for Anxiety depression and bipolar recheck.  Counseling provided for all of the vaccine components No orders of the defined types were placed in this encounter.   Fonda Levins, MD [facility] 05/23/2024, 9:13 AM     "

## 2024-06-06 ENCOUNTER — Encounter: Payer: Self-pay | Admitting: Family Medicine

## 2024-06-06 NOTE — Telephone Encounter (Signed)
 Okay to order future labs for patient to have her cholesterol and fasting blood glucose checked?  Needs this info filled out as part of the weight watchers form, in order for insurance to pay.

## 2024-06-16 ENCOUNTER — Other Ambulatory Visit: Payer: Self-pay

## 2024-06-16 DIAGNOSIS — E66813 Obesity, class 3: Secondary | ICD-10-CM

## 2024-06-18 ENCOUNTER — Other Ambulatory Visit

## 2024-06-18 DIAGNOSIS — E66813 Obesity, class 3: Secondary | ICD-10-CM

## 2024-06-18 LAB — CBC WITH DIFFERENTIAL/PLATELET
Basophils Absolute: 0.1 10*3/uL (ref 0.0–0.2)
Basos: 1 %
EOS (ABSOLUTE): 0.9 10*3/uL — ABNORMAL HIGH (ref 0.0–0.4)
Eos: 8 %
Hematocrit: 41.1 % (ref 34.0–46.6)
Hemoglobin: 13.8 g/dL (ref 11.1–15.9)
Immature Grans (Abs): 0.1 10*3/uL (ref 0.0–0.1)
Immature Granulocytes: 1 %
Lymphocytes Absolute: 2.9 10*3/uL (ref 0.7–3.1)
Lymphs: 26 %
MCH: 28 pg (ref 26.6–33.0)
MCHC: 33.6 g/dL (ref 31.5–35.7)
MCV: 84 fL (ref 79–97)
Monocytes Absolute: 0.6 10*3/uL (ref 0.1–0.9)
Monocytes: 6 %
Neutrophils Absolute: 6.3 10*3/uL (ref 1.4–7.0)
Neutrophils: 58 %
Platelets: 416 10*3/uL (ref 150–450)
RBC: 4.92 x10E6/uL (ref 3.77–5.28)
RDW: 13.6 % (ref 11.7–15.4)
WBC: 10.8 10*3/uL (ref 3.4–10.8)

## 2024-06-18 LAB — CMP14+EGFR
ALT: 28 [IU]/L (ref 0–32)
AST: 20 [IU]/L (ref 0–40)
Albumin: 4 g/dL (ref 4.0–5.0)
Alkaline Phosphatase: 71 [IU]/L (ref 41–116)
BUN/Creatinine Ratio: 8 — ABNORMAL LOW (ref 9–23)
BUN: 7 mg/dL (ref 6–20)
Bilirubin Total: 0.5 mg/dL (ref 0.0–1.2)
CO2: 22 mmol/L (ref 20–29)
Calcium: 8.7 mg/dL (ref 8.7–10.2)
Chloride: 102 mmol/L (ref 96–106)
Creatinine, Ser: 0.83 mg/dL (ref 0.57–1.00)
Globulin, Total: 2.3 g/dL (ref 1.5–4.5)
Glucose: 88 mg/dL (ref 70–99)
Potassium: 4.2 mmol/L (ref 3.5–5.2)
Sodium: 139 mmol/L (ref 134–144)
Total Protein: 6.3 g/dL (ref 6.0–8.5)
eGFR: 98 mL/min/{1.73_m2}

## 2024-06-18 LAB — LIPID PANEL
Chol/HDL Ratio: 4.1 ratio (ref 0.0–4.4)
Cholesterol, Total: 142 mg/dL (ref 100–199)
HDL: 35 mg/dL — ABNORMAL LOW
LDL Chol Calc (NIH): 73 mg/dL (ref 0–99)
Triglycerides: 203 mg/dL — ABNORMAL HIGH (ref 0–149)
VLDL Cholesterol Cal: 34 mg/dL (ref 5–40)

## 2024-06-18 LAB — BAYER DCA HB A1C WAIVED: HB A1C (BAYER DCA - WAIVED): 5.3 % (ref 4.8–5.6)

## 2024-06-18 LAB — TSH: TSH: 2.04 u[IU]/mL (ref 0.450–4.500)

## 2024-06-19 ENCOUNTER — Ambulatory Visit: Payer: Self-pay | Admitting: Family Medicine

## 2024-06-19 ENCOUNTER — Telehealth: Payer: Self-pay

## 2024-06-19 NOTE — Telephone Encounter (Signed)
 LVM regarding starting allergy  injections via [facility].  If/when patient calls back, give dates 2/13,2/16,2/20 and 2/23 and see if these work for them. Was given the option to start in the [place] office with ability to transfer to [facility]

## 2024-08-22 ENCOUNTER — Ambulatory Visit: Admitting: Family Medicine
